# Patient Record
Sex: Female | Born: 1980 | Race: Black or African American | Hispanic: No | Marital: Married | Smoking: Never smoker
Health system: Southern US, Community
[De-identification: ages and names within clinical notes are randomized; demographics above are authoritative.]

## PROBLEM LIST (undated history)

## (undated) DIAGNOSIS — J309 Allergic rhinitis, unspecified: Secondary | ICD-10-CM

## (undated) DIAGNOSIS — IMO0002 Reserved for concepts with insufficient information to code with codable children: Secondary | ICD-10-CM

## (undated) DIAGNOSIS — Z789 Other specified health status: Secondary | ICD-10-CM

## (undated) DIAGNOSIS — D649 Anemia, unspecified: Secondary | ICD-10-CM

## (undated) HISTORY — DX: Reserved for concepts with insufficient information to code with codable children: IMO0002

## (undated) HISTORY — DX: Anemia, unspecified: D64.9

## (undated) HISTORY — PX: TUBAL LIGATION: SHX77

## (undated) HISTORY — DX: Allergic rhinitis, unspecified: J30.9

---
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
## 2005-10-28 ENCOUNTER — Inpatient Hospital Stay (HOSPITAL_COMMUNITY): Admission: AD | Admit: 2005-10-28 | Discharge: 2005-10-28 | Payer: Self-pay | Admitting: Gynecology

## 2005-11-07 ENCOUNTER — Emergency Department (HOSPITAL_COMMUNITY): Admission: EM | Admit: 2005-11-07 | Discharge: 2005-11-08 | Payer: Self-pay | Admitting: Emergency Medicine

## 2005-11-19 ENCOUNTER — Emergency Department (HOSPITAL_COMMUNITY): Admission: EM | Admit: 2005-11-19 | Discharge: 2005-11-19 | Payer: Self-pay | Admitting: Emergency Medicine

## 2006-01-13 ENCOUNTER — Inpatient Hospital Stay (HOSPITAL_COMMUNITY): Admission: AD | Admit: 2006-01-13 | Discharge: 2006-01-13 | Payer: Self-pay | Admitting: Gynecology

## 2007-01-24 ENCOUNTER — Inpatient Hospital Stay (HOSPITAL_COMMUNITY): Admission: AD | Admit: 2007-01-24 | Discharge: 2007-01-24 | Payer: Self-pay | Admitting: Obstetrics & Gynecology

## 2007-02-13 ENCOUNTER — Inpatient Hospital Stay (HOSPITAL_COMMUNITY): Admission: AD | Admit: 2007-02-13 | Discharge: 2007-02-13 | Payer: Self-pay | Admitting: Obstetrics and Gynecology

## 2007-04-16 IMAGING — US US OB COMP LESS 14 WK
1 series · 14 of 28 positions shown · non-contrast
Comparison: none

CLINICAL DATA: Abdominal pain.  

 OBSTETRICAL ULTRASOUND AND TRANSVAGINAL OB US:
 Number of Fetuses: 1
 Heart Rate:  106
 CRL:  2.7 mm  5 w 6 d
 Ultrasound EDC:  09/09/06
 Fetal anatomy could not be evaluated due to the early gestational age.
 MATERNAL UTERINE AND ADNEXAL FINDINGS
 Cervix not evaluated < 16 weeks.

[Series 1: us ob comp less 14 wk · 0.31mm/px · 14 of 36 slices shown]
[im 2/36]
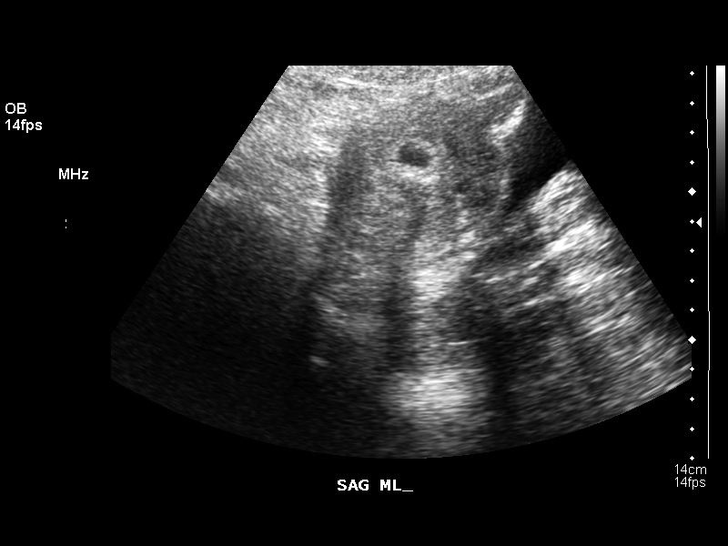
[im 4/36]
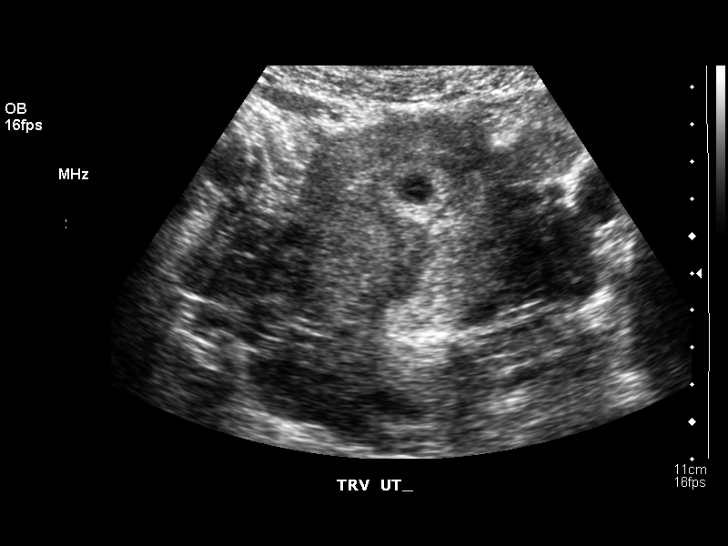
[im 7/36]
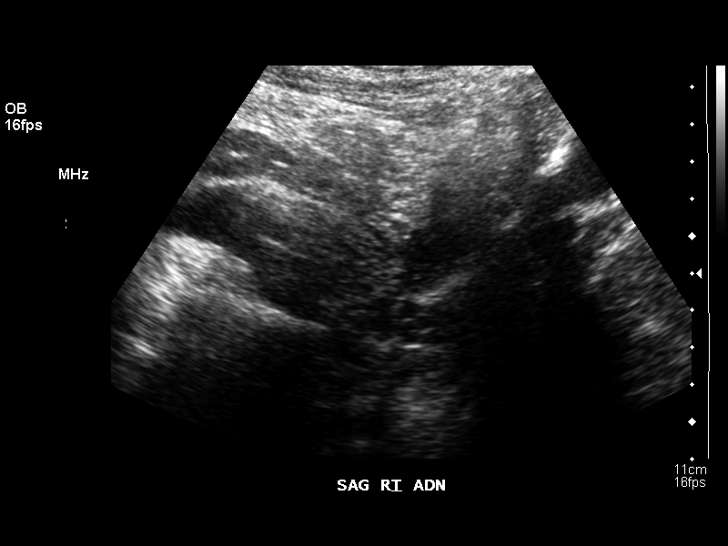
[im 10/36]
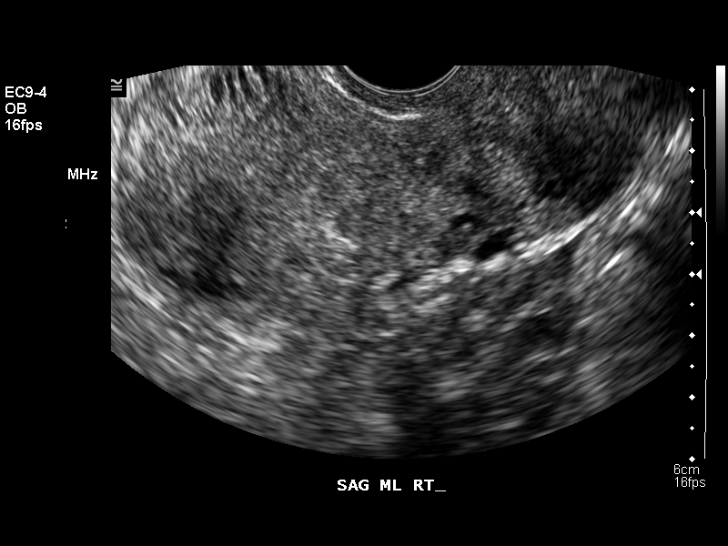
[im 12/36]
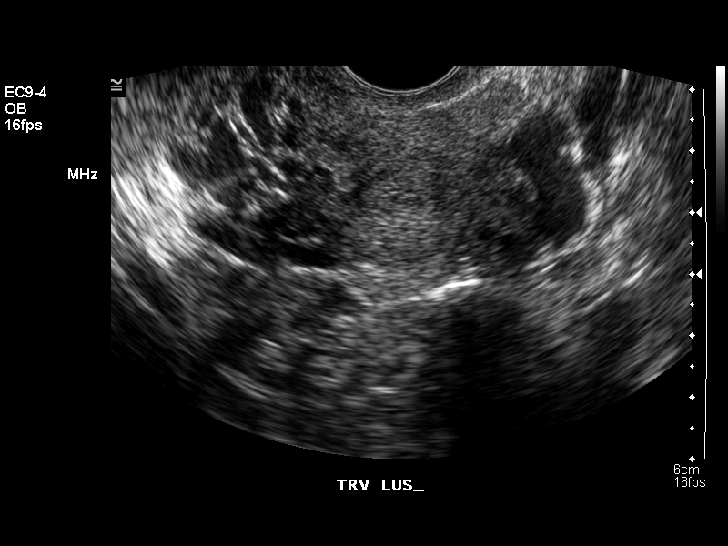
[im 15/36]
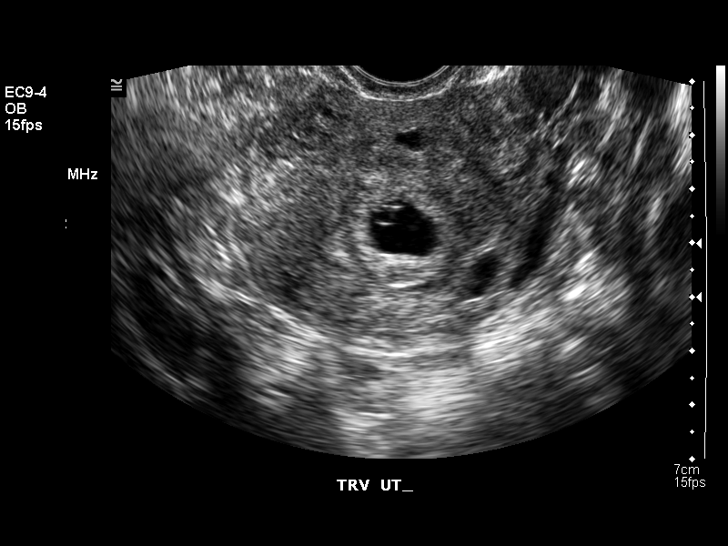
[im 17/36]
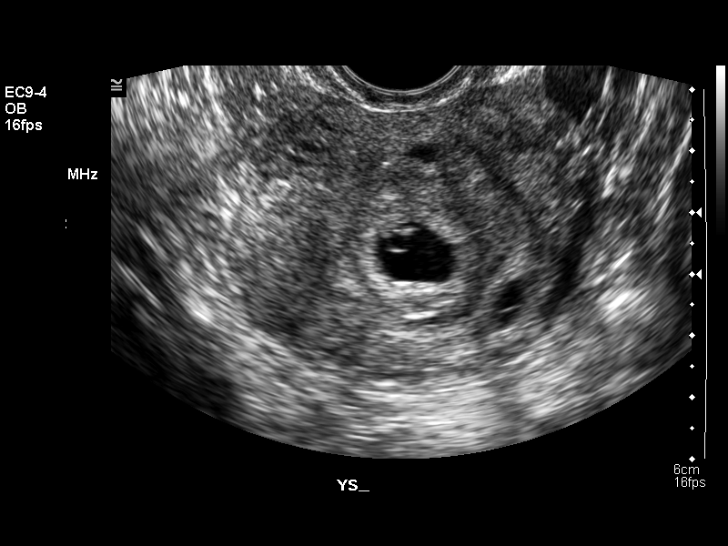
[im 20/36]
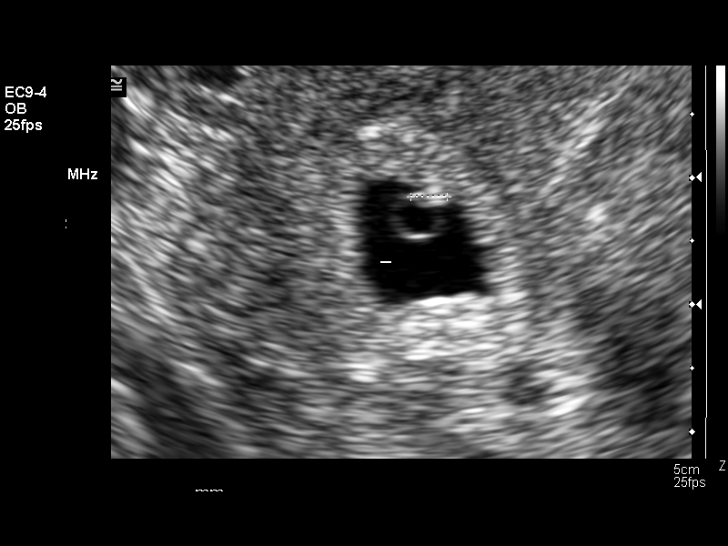
[im 23/36]
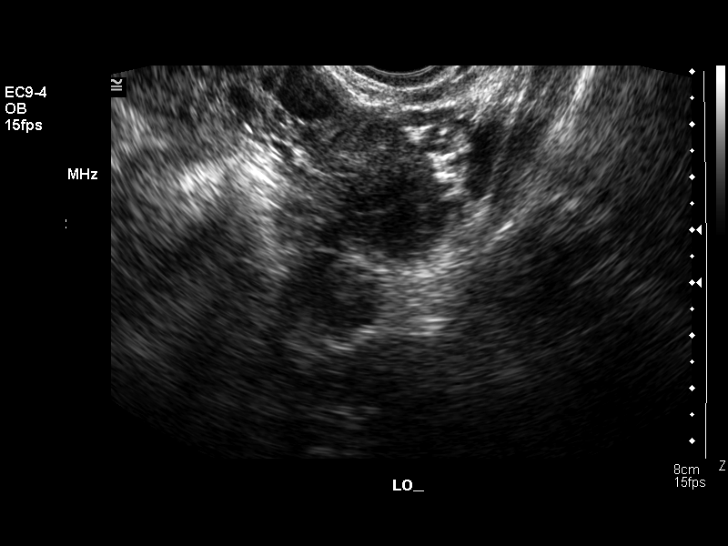
[im 25/36]
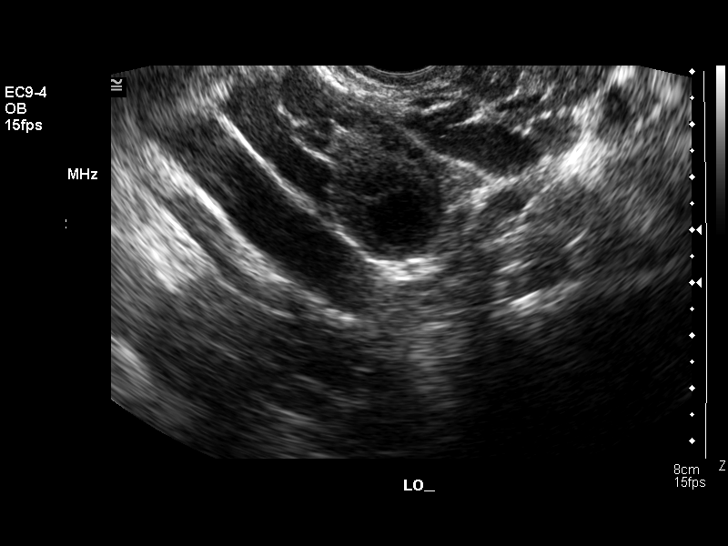
[im 28/36]
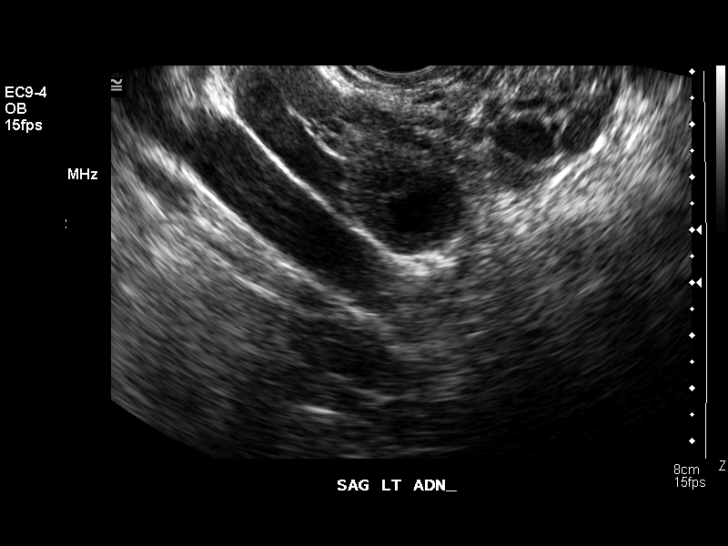
[im 30/36]
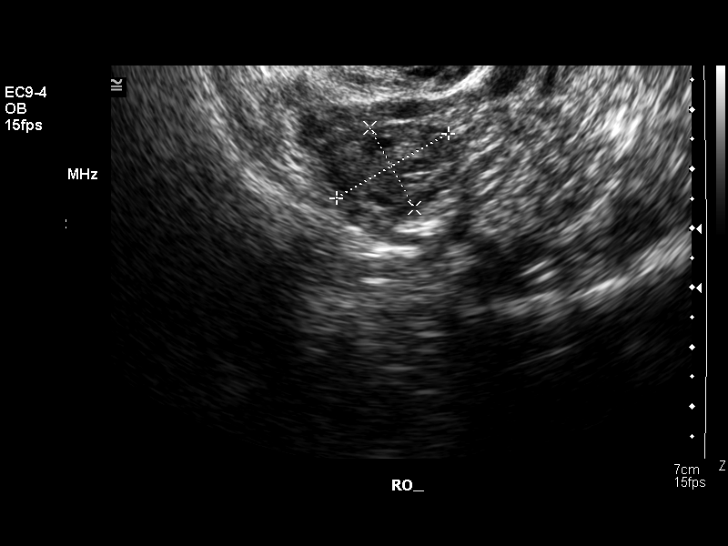
[im 33/36]
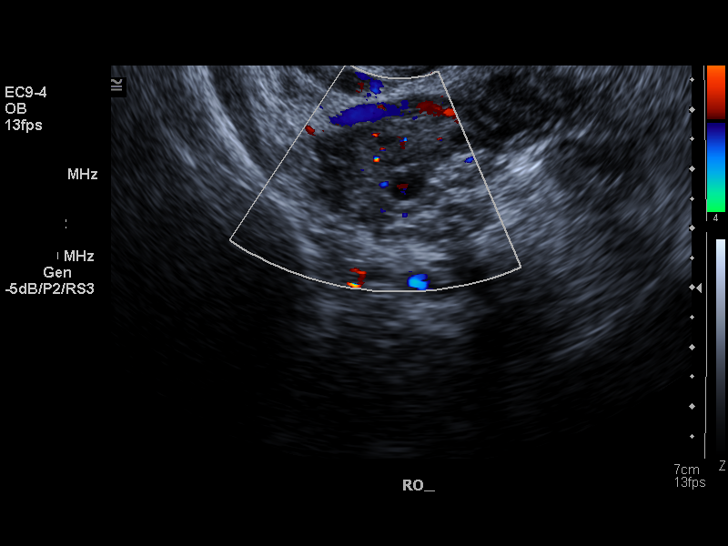
[im 36/36]
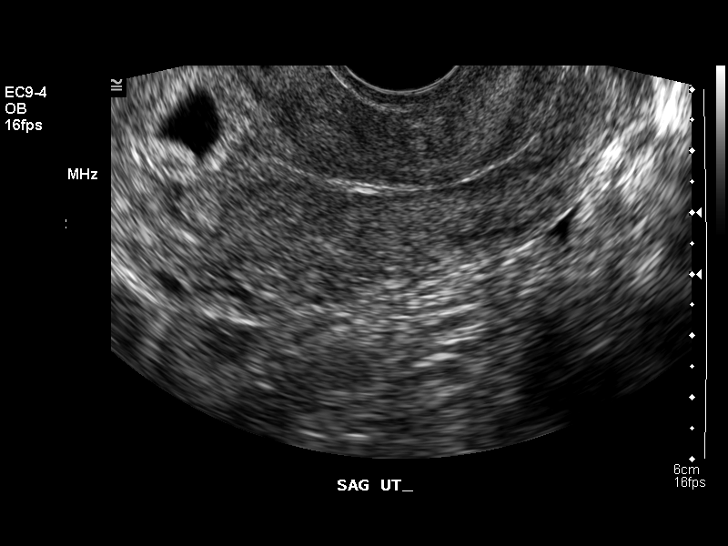

[14 of 28 positions shown; findings below may reference images not displayed]

IMPRESSION: 1.  Single living intrauterine gestation at estimated 5 weeks 6 day gestational age by crown rump length.  The yolk sac is visualized.  
 2.  A second smaller hypoechoic focus is identified discrete from the gestational sac.  This is not well-demonstrated on the provided images and could represent a small focus of subchorionic hemorrhage.  A second smaller associated gestational sac is considered much less likely.  Follow-up ultrasound in 7 to 10 days could be used to further assess.  
 3.  Left ovarian corpus luteum cyst with sonographically normal right ovary.  There is a trace amount of free fluid in the cul-de-sac.

## 2007-04-27 ENCOUNTER — Inpatient Hospital Stay (HOSPITAL_COMMUNITY): Admission: AD | Admit: 2007-04-27 | Discharge: 2007-04-27 | Payer: Self-pay | Admitting: Gynecology

## 2007-07-23 ENCOUNTER — Emergency Department (HOSPITAL_COMMUNITY): Admission: EM | Admit: 2007-07-23 | Discharge: 2007-07-23 | Payer: Self-pay | Admitting: Emergency Medicine

## 2008-02-28 ENCOUNTER — Inpatient Hospital Stay (HOSPITAL_COMMUNITY): Admission: AD | Admit: 2008-02-28 | Discharge: 2008-02-28 | Payer: Self-pay | Admitting: Obstetrics & Gynecology

## 2008-12-13 ENCOUNTER — Inpatient Hospital Stay (HOSPITAL_COMMUNITY): Admission: AD | Admit: 2008-12-13 | Discharge: 2008-12-13 | Payer: Self-pay | Admitting: Obstetrics & Gynecology

## 2009-01-03 ENCOUNTER — Emergency Department (HOSPITAL_COMMUNITY): Admission: EM | Admit: 2009-01-03 | Discharge: 2009-01-03 | Payer: Self-pay | Admitting: Emergency Medicine

## 2009-01-05 ENCOUNTER — Emergency Department (HOSPITAL_COMMUNITY): Admission: EM | Admit: 2009-01-05 | Discharge: 2009-01-05 | Payer: Self-pay | Admitting: Emergency Medicine

## 2010-11-18 LAB — GC/CHLAMYDIA PROBE AMP, GENITAL
Chlamydia, DNA Probe: NEGATIVE
GC Probe Amp, Genital: NEGATIVE

## 2010-11-18 LAB — WET PREP, GENITAL
Clue Cells Wet Prep HPF POC: NONE SEEN
Trich, Wet Prep: NONE SEEN
Yeast Wet Prep HPF POC: NONE SEEN

## 2010-11-18 LAB — URINALYSIS, ROUTINE W REFLEX MICROSCOPIC
Bilirubin Urine: NEGATIVE
Glucose, UA: NEGATIVE mg/dL
Hgb urine dipstick: NEGATIVE
Ketones, ur: NEGATIVE mg/dL
Nitrite: NEGATIVE
Protein, ur: NEGATIVE mg/dL
Specific Gravity, Urine: >1.03 — ABNORMAL HIGH (ref 1.005–1.030)
Urobilinogen, UA: 0.2 mg/dL (ref 0.0–1.0)
pH: 6 (ref 5.0–8.0)

## 2011-03-11 ENCOUNTER — Inpatient Hospital Stay (HOSPITAL_COMMUNITY): Payer: Self-pay

## 2011-03-11 ENCOUNTER — Encounter (HOSPITAL_COMMUNITY): Payer: Self-pay | Admitting: *Deleted

## 2011-03-11 ENCOUNTER — Inpatient Hospital Stay (HOSPITAL_COMMUNITY)
Admission: AD | Admit: 2011-03-11 | Discharge: 2011-03-11 | Disposition: A | Discharge status: Home / Self Care | Payer: Self-pay | Source: Ambulatory Visit | Attending: Obstetrics & Gynecology | Admitting: Obstetrics & Gynecology

## 2011-03-11 DIAGNOSIS — R109 Unspecified abdominal pain: Secondary | ICD-10-CM | POA: Insufficient documentation

## 2011-03-11 HISTORY — DX: Other specified health status: Z78.9

## 2011-03-11 LAB — URINALYSIS, ROUTINE W REFLEX MICROSCOPIC
Bilirubin Urine: NEGATIVE
Glucose, UA: NEGATIVE mg/dL
Hgb urine dipstick: NEGATIVE
Ketones, ur: NEGATIVE mg/dL
Leukocytes, UA: NEGATIVE
Nitrite: NEGATIVE
Protein, ur: NEGATIVE mg/dL
Specific Gravity, Urine: 1.015 (ref 1.005–1.030)
Urobilinogen, UA: 0.2 mg/dL (ref 0.0–1.0)
pH: 8 (ref 5.0–8.0)

## 2011-03-11 LAB — WET PREP, GENITAL
Trich, Wet Prep: NONE SEEN
Yeast Wet Prep HPF POC: NONE SEEN

## 2011-03-11 LAB — CBC
HCT: 35.1 % — ABNORMAL LOW (ref 36.0–46.0)
Hemoglobin: 11.5 g/dL — ABNORMAL LOW (ref 12.0–15.0)
MCH: 29.9 pg (ref 26.0–34.0)
MCHC: 32.8 g/dL (ref 30.0–36.0)
MCV: 91.4 fL (ref 78.0–100.0)
Platelets: 207 10*3/uL (ref 150–400)
RBC: 3.84 MIL/uL — ABNORMAL LOW (ref 3.87–5.11)
RDW: 13.6 % (ref 11.5–15.5)
WBC: 5.3 10*3/uL (ref 4.0–10.5)

## 2011-03-11 LAB — POCT PREGNANCY, URINE: Preg Test, Ur: NEGATIVE

## 2011-03-11 LAB — ABO/RH: ABO/RH(D): O POS

## 2011-03-11 LAB — HCG, QUANTITATIVE, PREGNANCY: hCG, Beta Chain, Quant, S: 1 m[IU]/mL (ref <5)

## 2011-03-11 MED ORDER — IBUPROFEN 600 MG PO TABS: 600.0000 mg | ORAL_TABLET | Freq: Four times a day (QID) | ORAL | Status: AC | PRN

## 2011-03-11 NOTE — ED Provider Notes (Signed)
Agree with above note.  Andrea Morris A 03/11/2011 2:33 PM

## 2011-03-11 NOTE — Progress Notes (Signed)
Pt states that 2 weeks ago she had nausea and did a HPT that was POS. Pt has had a BTL and was sure she was not pregnant.  Had heavy bleeding after the pregnancy test. Continues to have nausea, RLQ pain and low back pain that started around the same time. Only low back pain today.

## 2011-03-11 NOTE — ED Provider Notes (Signed)
History     Chief Complaint  Patient presents with  . Nausea   HPI History of positive pregnancy test at home.  Had heavy bleeding soon after that with clotting and cramping.  Yesterday began feeling bad with decreased appetite, low back pain, abdominal pain, and diarrhea.  Today has had vomiting, continued pain and diarrhea.  Still wonders if she might be pregnant.  Does not have a GYN doctor.  [facility].  OB History    Grav Para Term Preterm Abortions TAB SAB Ect Mult Living   5 5 5       5       Past Medical History  Diagnosis Date  . No pertinent past medical history     Past Surgical History  Procedure Date  . Tubal ligation     No family history on file.  History  Substance Use Topics  . Smoking status: Never Smoker   . Smokeless tobacco: Not on file  . Alcohol Use: Yes     occasional, social    Allergies: No Known Allergies  Prescriptions prior to admission  Medication Sig Dispense Refill  . Multiple Vitamins-Calcium (ONE-A-DAY WOMENS FORMULA) TABS Take 1 tablet by mouth daily.          Review of Systems  Gastrointestinal: Positive for abdominal pain.  Genitourinary:       No vaginal bleeding   Physical Exam   Blood pressure 108/86, pulse 75, temperature 97.8 F (36.6 C), resp. rate 16, height 5\' 5"  (1.651 m), weight 151 lb 3.2 oz (68.584 kg), last menstrual period 02/22/2011, SpO2 99.00%.  Physical Exam  Nursing note and vitals reviewed. Constitutional: She is oriented to person, place, and time. She appears well-developed and well-nourished.  HENT:  Head: Normocephalic.  Eyes: EOM are normal.  Neck: Neck supple.  GI: Soft. There is tenderness. There is no rebound and no guarding.  Genitourinary:       Speculum exam:  Small amount of creamy discharge Bimanual:  Cervix closed.  Uterus tender with exam.  Adnexa - no masses, nontender  Musculoskeletal: Normal range of motion.  Neurological: She is alert and oriented to  person, place, and time.  Skin: Skin is warm and dry.  Psychiatric: She has a normal mood and affect.    MAU Course  Procedures Results for orders placed during the hospital encounter of 03/11/11 (from the past 24 hour(s))  URINALYSIS, ROUTINE W REFLEX MICROSCOPIC     Status: Abnormal   Collection Time   03/11/11  9:49 AM      Component Value Range   Color, Urine YELLOW  YELLOW    Appearance HAZY (*) CLEAR    Specific Gravity, Urine 1.015  1.005 - 1.030    pH 8.0  5.0 - 8.0    Glucose, UA NEGATIVE  NEGATIVE (mg/dL)   Hgb urine dipstick NEGATIVE  NEGATIVE    Bilirubin Urine NEGATIVE  NEGATIVE    Ketones, ur NEGATIVE  NEGATIVE (mg/dL)   Protein, ur NEGATIVE  NEGATIVE (mg/dL)   Urobilinogen, UA 0.2  0.0 - 1.0 (mg/dL)   Nitrite NEGATIVE  NEGATIVE    Leukocytes, UA NEGATIVE  NEGATIVE   POCT PREGNANCY, URINE     Status: Normal   Collection Time   03/11/11  9:54 AM      Component Value Range   Preg Test, Ur NEGATIVE    CBC     Status: Abnormal   Collection Time   03/11/11 10:50 AM  Component Value Range   WBC 5.3  4.0 - 10.5 (K/uL)   RBC 3.84 (*) 3.87 - 5.11 (MIL/uL)   Hemoglobin 11.5 (*) 12.0 - 15.0 (g/dL)   HCT 14.7 (*) 82.9 - 46.0 (%)   MCV 91.4  78.0 - 100.0 (fL)   MCH 29.9  26.0 - 34.0 (pg)   MCHC 32.8  30.0 - 36.0 (g/dL)   RDW 56.2  13.0 - 86.5 (%)   Platelets 207  150 - 400 (K/uL)  HCG, QUANTITATIVE, PREGNANCY     Status: Normal   Collection Time   03/11/11 10:50 AM      Component Value Range   hCG, Beta Chain, Quant, S 1  <5 (mIU/mL)  ABO/RH     Status: Normal   Collection Time   03/11/11 10:50 AM      Component Value Range   ABO/RH(D) O POS    WET PREP, GENITAL     Status: Abnormal   Collection Time   03/11/11 11:30 AM      Component Value Range   Yeast, Wet Prep NONE SEEN  NONE SEEN    Trich, Wet Prep NONE SEEN  NONE SEEN    Clue Cells, Wet Prep FEW (*) NONE SEEN    WBC, Wet Prep HPF POC FEW (*) NONE SEEN     MDM Clinical Data: Pelvic pain. Abnormal bleeding.   TRANSABDOMINAL AND TRANSVAGINAL ULTRASOUND OF PELVIS  Technique: Both transabdominal and transvaginal ultrasound  examinations of the pelvis were performed. Transabdominal technique  was performed for global imaging of the pelvis including uterus,  ovaries, adnexal regions, and pelvic cul-de-sac.  Comparison: None.  It was necessary to proceed with endovaginal exam following the  transabdominal exam to visualize the ovaries.  Findings:  Uterus: Measures 9.6 by 4.8 by 6.3 cm. A subendometrial cyst in the  anterior uterus measures 5 mm. 3 mm subendometrial cyst is seen in  the posterior fundus. Uterine echotexture is slightly  heterogeneous with tiny echogenic focal areas with some linear  shadowing. No focal solid mass is identified within the uterus.  Endometrium: Normal in thickness and appearance. Is trilaminar and  measures 6 mm in maximum thickness.  Right ovary: The right ovary measures 2.8 x 1.6 x 2.2 cm and  contains a few small follicles. No ovarian or adnexal mass.  Left ovary: The left ovary measures 4.8 x 2.2 x 2.8 cm and contains  multiple follicles. It contains a dominant follicle that measures  1.7 cm. No ovarian or adnexal mass.  Other findings: No free fluid  IMPRESSION:  1. Question uterine adenomyosis.  2. The left ovary is mildly prominent in size and contains  multiple follicles, including a dominant follicle.  3. Normal right ovary.    Assessment and Plan  Not pregnant Abdominal pain  Plan: Ibuprofen for pain Call to make an appointment at GYN clinic if pain continues.   BURLESON,TERRI 03/11/2011, 10:39 AM   Nolene Bernheim, NP 03/11/11 1432

## 2011-03-12 LAB — GC/CHLAMYDIA PROBE AMP, GENITAL
Chlamydia, DNA Probe: NEGATIVE
GC Probe Amp, Genital: NEGATIVE

## 2011-05-08 LAB — WET PREP, GENITAL
Trich, Wet Prep: NONE SEEN
Yeast Wet Prep HPF POC: NONE SEEN

## 2011-05-08 LAB — POCT PREGNANCY, URINE
Operator id: 120561
Preg Test, Ur: NEGATIVE

## 2011-05-08 LAB — URINALYSIS, ROUTINE W REFLEX MICROSCOPIC
Bilirubin Urine: NEGATIVE
Glucose, UA: NEGATIVE
Hgb urine dipstick: NEGATIVE
Ketones, ur: NEGATIVE
Nitrite: NEGATIVE
Protein, ur: NEGATIVE
Specific Gravity, Urine: 1.02
Urobilinogen, UA: 0.2
pH: 7.5

## 2011-05-08 LAB — CBC
HCT: 34.5 — ABNORMAL LOW
Hemoglobin: 11.5 — ABNORMAL LOW
MCHC: 33.4
MCV: 86.5
Platelets: 234
RBC: 3.99
RDW: 14.8
WBC: 7.3

## 2011-05-08 LAB — GC/CHLAMYDIA PROBE AMP, GENITAL
Chlamydia, DNA Probe: NEGATIVE
GC Probe Amp, Genital: NEGATIVE

## 2011-05-21 LAB — GC/CHLAMYDIA PROBE AMP, GENITAL
Chlamydia, DNA Probe: NEGATIVE
GC Probe Amp, Genital: NEGATIVE

## 2011-05-21 LAB — WET PREP, GENITAL
Trich, Wet Prep: NONE SEEN
Yeast Wet Prep HPF POC: NONE SEEN

## 2011-05-21 LAB — URINALYSIS, ROUTINE W REFLEX MICROSCOPIC
Bilirubin Urine: NEGATIVE
Glucose, UA: NEGATIVE
Hgb urine dipstick: NEGATIVE
Ketones, ur: 15 — AB
Nitrite: NEGATIVE
Protein, ur: NEGATIVE
Specific Gravity, Urine: 1.015
Urobilinogen, UA: 1
pH: 7

## 2011-05-21 LAB — CBC
HCT: 35.9 — ABNORMAL LOW
Hemoglobin: 12.4
MCHC: 34.5
MCV: 89
Platelets: 199
RBC: 4.03
RDW: 13.1
WBC: 13.8 — ABNORMAL HIGH

## 2011-05-21 LAB — POCT PREGNANCY, URINE
Operator id: 181461
Preg Test, Ur: POSITIVE

## 2011-05-26 LAB — HCG, SERUM, QUALITATIVE: Preg, Serum: NEGATIVE

## 2011-05-26 LAB — GC/CHLAMYDIA PROBE AMP, GENITAL
Chlamydia, DNA Probe: NEGATIVE
GC Probe Amp, Genital: NEGATIVE

## 2011-05-26 LAB — WET PREP, GENITAL: Yeast Wet Prep HPF POC: NONE SEEN

## 2011-05-26 LAB — URINALYSIS, ROUTINE W REFLEX MICROSCOPIC
Glucose, UA: NEGATIVE
Hgb urine dipstick: NEGATIVE
Ketones, ur: NEGATIVE
Nitrite: NEGATIVE
Protein, ur: NEGATIVE
Specific Gravity, Urine: 1.025
Urobilinogen, UA: 1
pH: 6

## 2011-05-26 LAB — CBC
HCT: 36.9
Hemoglobin: 12.2
MCHC: 33.1
MCV: 89.4
Platelets: 248
RBC: 4.13
RDW: 13.9
WBC: 8.7

## 2011-05-26 LAB — URINE MICROSCOPIC-ADD ON

## 2011-05-26 LAB — POCT PREGNANCY, URINE
Operator id: 22333
Preg Test, Ur: NEGATIVE

## 2011-05-27 LAB — URINALYSIS, ROUTINE W REFLEX MICROSCOPIC
Bilirubin Urine: NEGATIVE
Glucose, UA: NEGATIVE
Hgb urine dipstick: NEGATIVE
Ketones, ur: NEGATIVE
Nitrite: NEGATIVE
Protein, ur: NEGATIVE
Specific Gravity, Urine: 1.025
Urobilinogen, UA: 0.2
pH: 6

## 2011-05-27 LAB — GC/CHLAMYDIA PROBE AMP, GENITAL
Chlamydia, DNA Probe: NEGATIVE
GC Probe Amp, Genital: NEGATIVE

## 2011-05-27 LAB — WET PREP, GENITAL
Clue Cells Wet Prep HPF POC: NONE SEEN
Trich, Wet Prep: NONE SEEN
Yeast Wet Prep HPF POC: NONE SEEN

## 2011-05-27 LAB — URINE MICROSCOPIC-ADD ON: RBC / HPF: NONE SEEN

## 2011-05-27 LAB — POCT PREGNANCY, URINE
Operator id: 22333
Preg Test, Ur: NEGATIVE

## 2011-05-27 LAB — HCG, SERUM, QUALITATIVE: Preg, Serum: NEGATIVE

## 2011-11-23 ENCOUNTER — Emergency Department (HOSPITAL_COMMUNITY)
Admission: EM | Admit: 2011-11-23 | Discharge: 2011-11-24 | Disposition: A | Discharge status: Home / Self Care | Payer: Managed Care, Other (non HMO) | Attending: Emergency Medicine | Admitting: Emergency Medicine

## 2011-11-23 ENCOUNTER — Encounter (HOSPITAL_COMMUNITY): Payer: Self-pay | Admitting: Emergency Medicine

## 2011-11-23 DIAGNOSIS — R42 Dizziness and giddiness: Secondary | ICD-10-CM | POA: Insufficient documentation

## 2011-11-23 DIAGNOSIS — F0781 Postconcussional syndrome: Secondary | ICD-10-CM | POA: Insufficient documentation

## 2011-11-23 LAB — POCT PREGNANCY, URINE: Preg Test, Ur: NEGATIVE
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
## 2011-11-24 ENCOUNTER — Emergency Department (HOSPITAL_COMMUNITY): Payer: Managed Care, Other (non HMO)

## 2011-11-24 LAB — POCT I-STAT, CHEM 8
BUN: 14 mg/dL (ref 6–23)
Calcium, Ion: 1.19 mmol/L (ref 1.12–1.32)
Chloride: 106 mEq/L (ref 96–112)
Creatinine, Ser: 0.7 mg/dL (ref 0.50–1.10)
Glucose, Bld: 93 mg/dL (ref 70–99)
HCT: 39 % (ref 36.0–46.0)
Hemoglobin: 13.3 g/dL (ref 12.0–15.0)
Potassium: 3.5 mEq/L (ref 3.5–5.1)
Sodium: 141 mEq/L (ref 135–145)
TCO2: 24 mmol/L (ref 0–100)

## 2011-11-24 LAB — CBC
HCT: 36.8 % (ref 36.0–46.0)
Hemoglobin: 12.2 g/dL (ref 12.0–15.0)
MCH: 29.6 pg (ref 26.0–34.0)
MCHC: 33.2 g/dL (ref 30.0–36.0)
MCV: 89.3 fL (ref 78.0–100.0)
Platelets: 229 10*3/uL (ref 150–400)
RBC: 4.12 MIL/uL (ref 3.87–5.11)
RDW: 12.8 % (ref 11.5–15.5)
WBC: 11.2 10*3/uL — ABNORMAL HIGH (ref 4.0–10.5)

## 2011-11-24 MED ORDER — MECLIZINE HCL 50 MG PO TABS: 50.0000 mg | ORAL_TABLET | Freq: Three times a day (TID) | ORAL | Status: AC | PRN

## 2011-11-24 MED ORDER — MECLIZINE HCL 25 MG PO TABS
25.0000 mg | ORAL_TABLET | Freq: Once | ORAL | Status: AC
  Administered 2011-11-24: 25 mg via ORAL
  Filled 2011-11-24: qty 1
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
## 2011-11-24 NOTE — Discharge Instructions (Signed)
Please followup with your primary care Dr. for recheck in 3-5 days. Also call the neurology clinic for followup. Return to the emergency department for worsening condition. Make sure that you're eating well and drinking enough fluids. Rest when you can. Take medications as prescribed. Return to the emergency room for worsening condition or new concerning symptoms.  Post-Concussion Syndrome Post-concussion syndrome describes the symptoms that can occur after a head injury. These symptoms can last from weeks to months. CAUSES  It is not clear why some head injuries cause post-concussion syndrome. It can occur whether your head injury was mild or severe and whether you were wearing head protection or not.  SYMPTOMS  Memory difficulties.   Dizziness.   Headaches.   Double vision or blurry vision.   Sensitivity to light.   Hearing difficulties.   Depression.   Tiredness.   Weakness.   Difficulty with concentration.   Difficulty sleeping or staying asleep.   Vomiting.  DIAGNOSIS  There is no test to determine whether you have post-concussion syndrome. Your caregiver may order an imaging scan of your brain, such as a CT scan, to check for other problems that may be causing your symptoms (such as severe injury inside your skull). TREATMENT  Usually, these problems disappear over time without medical care. Your caregiver may prescribe medicine to help ease your symptoms. It is important to follow up with a neurologist to evaluate your recovery and address any lingering symptoms or issues. HOME CARE INSTRUCTIONS   Only take over-the-counter or prescription medicines for pain, discomfort, or fever as directed by your caregiver. Do not take aspirin. Aspirin can slow blood clotting.   Sleep with your head slightly elevated to help with headaches.   Avoid any situation where there is potential for another head injury (football, hockey, martial arts, horseback riding). Your condition will  get worse every time you experience a concussion. You should avoid these activities until you are evaluated by the appropriate follow-up caregivers.   Keep all follow-up appointments as directed by your caregiver.  SEEK IMMEDIATE MEDICAL CARE IF:  You develop confusion or unusual drowsiness.   You cannot wake the injured person.   You develop nausea or persistent, forceful vomiting.   You feel like you are moving when you are not (vertigo).   You notice the injured person's eyes moving rapidly back and forth. This may be a sign of vertigo.   You have convulsions or faint.   You have severe, persistent headaches that are not relieved by medicine.   You cannot use your arms or legs normally.   Your pupils change size.   You have clear or bloody discharge from the nose or ears.   Your problems are getting worse, not better.  MAKE SURE YOU:  Understand these instructions.   Will watch your condition.   Will get help right away if you are not doing well or get worse.  Document Released: 01/16/2002 Document Revised: 07/16/2011 Document Reviewed: 02/12/2011 [facility] Patient Information 2012 [place], [place].

## 2014-03-21 ENCOUNTER — Encounter: Payer: Self-pay | Admitting: Gynecology

## 2014-03-21 ENCOUNTER — Ambulatory Visit (INDEPENDENT_AMBULATORY_CARE_PROVIDER_SITE_OTHER): Payer: BC Managed Care – PPO | Admitting: Gynecology

## 2014-03-21 VITALS — BP 102/70 | HR 74 | Resp 16 | Ht 66.0 in | Wt 156.0 lb

## 2014-03-21 DIAGNOSIS — D649 Anemia, unspecified: Secondary | ICD-10-CM

## 2014-03-21 DIAGNOSIS — N920 Excessive and frequent menstruation with regular cycle: Secondary | ICD-10-CM

## 2014-03-21 DIAGNOSIS — N898 Other specified noninflammatory disorders of vagina: Secondary | ICD-10-CM

## 2014-03-21 DIAGNOSIS — Z124 Encounter for screening for malignant neoplasm of cervix: Secondary | ICD-10-CM

## 2014-03-21 DIAGNOSIS — Z0000 Encounter for general adult medical examination without abnormal findings: Secondary | ICD-10-CM

## 2014-03-21 DIAGNOSIS — N9489 Other specified conditions associated with female genital organs and menstrual cycle: Secondary | ICD-10-CM

## 2014-03-21 DIAGNOSIS — Z01419 Encounter for gynecological examination (general) (routine) without abnormal findings: Secondary | ICD-10-CM

## 2014-03-21 LAB — IBC PANEL
%SAT: 13 % — ABNORMAL LOW (ref 20–55)
TIBC: 351 ug/dL (ref 250–470)
UIBC: 307 ug/dL (ref 125–400)

## 2014-03-21 LAB — HEMOGLOBIN, FINGERSTICK: Hemoglobin, fingerstick: 11.7 g/dL — ABNORMAL LOW (ref 12.0–16.0)

## 2014-03-21 LAB — IRON: Iron: 44 ug/dL (ref 42–145)

## 2014-03-21 MED ORDER — TINIDAZOLE 500 MG PO TABS: 2.0000 g | ORAL_TABLET | Freq: Once | ORAL | Status: DC

## 2014-03-21 NOTE — Progress Notes (Signed)
33 y.o. Engaged African American female   [phone] here for annual exam. Pt is currently sexually active.  Pt reports dyspareunia associated with vaginal discharge.  Pt reports musty discharge, pt was treated with antibiotic? And otc rx but did not work.  Pt does not douche.  Odor is worse after sex, menses.  No tub bathes. Cycles are regular, flow 5-7d, no clots, changes pad once an hour for 2d.  Patient's last menstrual period was 03/15/2014.          Sexually active: Yes.    The current method of family planning is tubal ligation.    Exercising: Yes.    walking at work Last pap: 1 year ago- normal Alcohol: occasionally  Tobacco: no BSE: no  Hgb: 11.7 ; Urine: Negative    There are no preventive care reminders to display for this patient.  Family History  Problem Relation Age of Onset  . Diabetes Mellitus I Daughter     There are no active problems to display for this patient.   Past Medical History  Diagnosis Date  . No pertinent past medical history   . Anemia   . Dyspareunia     Past Surgical History  Procedure Laterality Date  . Tubal ligation  6 years ago     Allergies: Review of patient's allergies indicates no known allergies.  No current outpatient prescriptions on file.   No current facility-administered medications for this visit.    ROS: Pertinent items are noted in HPI.  Exam:    BP 102/70  Pulse 74  Resp 16  Ht 5\' 6"  (1.676 m)  Wt 156 lb (70.761 kg)  BMI 25.19 kg/m2  LMP 03/15/2014 Weight change: @WEIGHTCHANGE @ Last 3 height recordings:  Ht Readings from Last 3 Encounters:  03/21/14 5\' 6"  (1.676 m)  03/11/11 5\' 5"  (1.651 m)   General appearance: alert, cooperative and appears stated age Head: Normocephalic, without obvious abnormality, atraumatic Neck: no adenopathy, no carotid bruit, no JVD, supple, symmetrical, trachea midline and thyroid not enlarged, symmetric, no tenderness/mass/nodules Lungs: clear to auscultation  bilaterally Breasts: normal appearance, no masses or tenderness Heart: regular rate and rhythm, S1, S2 normal, no murmur, click, rub or gallop Abdomen: soft, non-tender; bowel sounds normal; no masses,  no organomegaly Extremities: extremities normal, atraumatic, no cyanosis or edema Skin: Skin color, texture, turgor normal. No rashes or lesions, shallow lesion on left upper inner thigh-ingrown hair Lymph nodes: Cervical, supraclavicular, and axillary nodes normal. no inguinal nodes palpated Neurologic: Grossly normal   Pelvic: External genitalia:  normal escutcheon              Urethra: normal appearing urethra with no masses, tenderness or lesions              Bartholins and Skenes: Bartholin's, Urethra, Skene's normal                 Vagina: normal appearing vagina with normal color and discharge, no lesions, vaginal discharge - white, scant and thin, pH 5.5, +whiff, insufficient discharge for slide              Cervix: normal appearance and patulous              Pap taken: Yes.          Bimanual Exam:  Uterus:  uterus is normal size, shape, consistency and nontender, examined upright mild descent and rectocele shortening vagina  Adnexa:    no masses                                      Rectovaginal: normal                                      Anus:  normal sphincter tone, no lesions    1. 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 Laboratory examination ordered as part of a routine general medical examination  - POCT Urinalysis Dipstick - Hemoglobin, fingerstick  3. Menorrhagia with regular cycle Pt mildly anemic, pt reports normal hemoglobin,  recommend PUS, iron studies  4. Screening for cervical cancer  - Pap Test with HP (IPS)  5. Vaginal  odor Insufficient material for slide, suspect BV based on pH and whiff,  Genital hygiene reviewed Recurrent problem, recommend test of cure and discussed suppression options - tinidazole (TINDAMAX) 500 MG tablet; Take 4 tablets (2,000 mg total) by mouth once. For 2d  Dispense: 8 tablet; Refill: 0  An After Visit Summary was printed and given to the patient.

## 2014-03-22 ENCOUNTER — Telehealth: Payer: Self-pay

## 2014-03-22 LAB — FERRITIN: Ferritin: 7 ng/mL — ABNORMAL LOW (ref 10–291)

## 2014-03-22 NOTE — Telephone Encounter (Signed)
Spoke with patient. Advised of message from Dr.Lathrop. Patient is agreeable. As we were about to scheduled phone disconnected. Tried to reach patient back x2 but call went straight to voicemail. Left message to call [facility] at [phone].

## 2014-03-22 NOTE — Telephone Encounter (Signed)
Left message to call [place] at [phone].  Per Dr.Lathrop advised PUS recommended for heavy bleeding with cycle. Dr.Lathrop forgo to mention this at appointment yesterday.

## 2014-03-22 NOTE — Telephone Encounter (Signed)
Pt is returning a call to [facility]

## 2014-03-22 NOTE — Telephone Encounter (Signed)
Left message to call Kaitlyn at [phone]. 

## 2014-03-23 ENCOUNTER — Telehealth: Payer: Self-pay | Admitting: *Deleted

## 2014-03-23 LAB — IPS PAP TEST WITH HPV

## 2014-03-23 NOTE — Telephone Encounter (Signed)
Spoke with patient at time of incoming call. I could not hear anyone on the line and phone call ended. Returned call to patient with no answer. Left message to call [place] at [phone].

## 2014-03-23 NOTE — Telephone Encounter (Signed)
Patient notified of results by Rolla Etienne ,RN  Routed to provider for review, encounter closed.

## 2014-03-23 NOTE — Telephone Encounter (Signed)
Spoke with patient. Appointment scheduled for ultrasound on 8/25 at 2:30pm with 3pm consult with Dr.Lathrop. Patient agreeable to date and time. Pelvic U/S scheduled and patient aware/agreeable to time.  Patient verbalized understanding of the U/S appointment cancellation policy. Advised will need to cancel within 72 business hours (3 business days) or will have $100.00 no show fee placed to account. Patient would like to know about cost. Advised patient precert has not yet been done but once it is someone in the billing department will be in contact with her regarding OOP cost. Patient is agreeable. Results given as seen below from Dr.Lathrop patient is agreeable and verbalizes understanding.   Notes Recorded by Azalia Bilis, MD on 03/23/2014 at 7:38 AM Inform pt she is iron deficient and shoud take otc iron supplements twice a day, might want to take a stool softener as well as can be constipating-colace should be fine  CC: Felipa Emory for [employer] to provider for final review. Patient agreeable to disposition. Will close encounter

## 2014-03-23 NOTE — Telephone Encounter (Signed)
Message copied by Alfonzo Feller on Fri Mar 23, 2014  9:18 AM ------      Message from: Elveria Rising      Created: Fri Mar 23, 2014  7:38 AM       Inform pt she is iron deficient and shoud take otc iron supplements twice a day, might want to take a stool softener as well as can be constipating-colace should be fine ------

## 2014-03-23 NOTE — Telephone Encounter (Signed)
Patient returning Jasmine's call.  

## 2014-03-23 NOTE — Telephone Encounter (Signed)
Left Message To Call Back  

## 2014-03-26 NOTE — Telephone Encounter (Signed)
Left message for patient to call back. Need to go over benefits for and schedule PUS.

## 2014-03-26 NOTE — Telephone Encounter (Signed)
Spoke with patient. Advised that copay for PUS is $25. Confirmed for patient that appt is 08.25.2015 @ 1430.

## 2014-04-03 ENCOUNTER — Ambulatory Visit (INDEPENDENT_AMBULATORY_CARE_PROVIDER_SITE_OTHER): Payer: BC Managed Care – PPO | Admitting: Gynecology

## 2014-04-03 ENCOUNTER — Ambulatory Visit (INDEPENDENT_AMBULATORY_CARE_PROVIDER_SITE_OTHER): Payer: BC Managed Care – PPO

## 2014-04-03 VITALS — BP 112/70 | Resp 16 | Ht 66.0 in | Wt 159.0 lb

## 2014-04-03 DIAGNOSIS — N898 Other specified noninflammatory disorders of vagina: Secondary | ICD-10-CM

## 2014-04-03 DIAGNOSIS — N920 Excessive and frequent menstruation with regular cycle: Secondary | ICD-10-CM

## 2014-04-03 DIAGNOSIS — N9489 Other specified conditions associated with female genital organs and menstrual cycle: Secondary | ICD-10-CM

## 2014-04-03 DIAGNOSIS — D649 Anemia, unspecified: Secondary | ICD-10-CM

## 2014-04-03 MED ORDER — NONFORMULARY OR COMPOUNDED ITEM: Status: DC
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
## 2014-04-03 NOTE — Patient Instructions (Signed)
PLACE 1 CAPSULE IN VAGINA FOR 3 NIGHTS IN A ROW, NO SEX, THEN ROUTINELY AFTER SEX AND MENSES

## 2014-04-06 ENCOUNTER — Ambulatory Visit: Payer: BC Managed Care – PPO | Admitting: Gynecology

## 2014-04-06 NOTE — Telephone Encounter (Signed)
Patient dnka two week recheck appointment today @12 :00. I left patient a message to call and reschedule.

## 2014-05-07 ENCOUNTER — Ambulatory Visit: Payer: BC Managed Care – PPO | Admitting: Gynecology

## 2014-05-09 ENCOUNTER — Ambulatory Visit: Payer: BC Managed Care – PPO | Admitting: Gynecology

## 2014-05-09 ENCOUNTER — Telehealth: Payer: Self-pay | Admitting: Gynecology

## 2014-05-09 NOTE — Telephone Encounter (Signed)
Spoke with patient. Appointment rescheduled for Friday at 2pm (time per provider). Patient agreeable to date and time.  Routing to provider for final review. Patient agreeable to disposition. Will close encounter

## 2014-05-09 NOTE — Telephone Encounter (Signed)
Patient dnka her 2 week recheck appointment with Dr.Lathrop. I called patient and left a message for her to call and reschedule.

## 2014-05-09 NOTE — Telephone Encounter (Signed)
Patient's original appointment 05/07/14 was cx (provider was delayed in surgery) and rescheduled to today. Patient says "she forgot the appointment today". Patient need to reschedule 2 week reck. No appointment available with Dr.Lathrop.Marland Kitchen

## 2014-05-11 ENCOUNTER — Ambulatory Visit (INDEPENDENT_AMBULATORY_CARE_PROVIDER_SITE_OTHER): Payer: BC Managed Care – PPO | Admitting: Gynecology

## 2014-05-11 VITALS — BP 98/78 | Temp 97.7°F | Resp 12 | Ht 66.0 in | Wt 168.0 lb

## 2014-05-11 DIAGNOSIS — D500 Iron deficiency anemia secondary to blood loss (chronic): Secondary | ICD-10-CM

## 2014-05-11 DIAGNOSIS — N920 Excessive and frequent menstruation with regular cycle: Secondary | ICD-10-CM

## 2014-05-11 DIAGNOSIS — N898 Other specified noninflammatory disorders of vagina: Secondary | ICD-10-CM

## 2014-05-11 DIAGNOSIS — N9489 Other specified conditions associated with female genital organs and menstrual cycle: Secondary | ICD-10-CM

## 2014-05-11 LAB — POCT WET PREP (WET MOUNT): Clue Cells Wet Prep Whiff POC: POSITIVE

## 2014-05-11 LAB — POCT URINALYSIS DIPSTICK
Urobilinogen, UA: NEGATIVE
pH, UA: 5

## 2014-05-11 LAB — CBC
HCT: 37.3 % (ref 36.0–46.0)
Hemoglobin: 12.5 g/dL (ref 12.0–15.0)
MCH: 29.8 pg (ref 26.0–34.0)
MCHC: 33.5 g/dL (ref 30.0–36.0)
MCV: 89 fL (ref 78.0–100.0)
Platelets: 268 10*3/uL (ref 150–400)
RBC: 4.19 MIL/uL (ref 3.87–5.11)
RDW: 13.9 % (ref 11.5–15.5)
WBC: 7.3 10*3/uL (ref 4.0–10.5)

## 2014-05-11 MED ORDER — METRONIDAZOLE 0.75 % VA GEL: VAGINAL | Status: DC

## 2014-05-11 NOTE — Progress Notes (Signed)
Subjective:     Patient ID: Andrea Morris, female   DOB: February 05, 1981, 33 y.o.   MRN: [id number]  HPI Comments: Pt is here for test of cure after recurrent BV.  Pt is using boric acid every 3rd night and after sex.  Pt  Denies any issue with vaginal discharge or odor.  Partner without complaints. Pt reports change in urination, more frequent but no change in stream, dysuria or hematuria.      Review of Systems Per HPI    Objective:   Physical Exam  Nursing note and vitals reviewed. Constitutional: She is oriented to person, place, and time. She appears well-developed and well-nourished.  Genitourinary:   Pelvic: External genitalia:  no lesions              Urethra:  normal appearing urethra with no masses, tenderness or lesions              Bartholins and Skenes: normal                 Vagina: moderate thick white discharge              Cervix: normal appearance                     Bimanual Exam:  Uterus:  uterus is normal size, shape, consistency and tender anteriorly and under bladder                                       Adnexa: normal adnexa in size, nontender and no masses                                        Neurological: She is alert and oriented to person, place, and time.       Assessment:     Persistent BV Change in urination     Plan:     Will try metrogel for suppression, stop boric acid tindmax for treatment Urine culture  F/u 72m

## 2014-05-12 LAB — URINE CULTURE
Colony Count: NO GROWTH
Organism ID, Bacteria: NO GROWTH

## 2014-05-14 ENCOUNTER — Telehealth: Payer: Self-pay | Admitting: *Deleted

## 2014-05-14 NOTE — Telephone Encounter (Signed)
Pt informed of results.

## 2014-05-14 NOTE — Telephone Encounter (Signed)
Patient returning call.

## 2014-05-14 NOTE — Telephone Encounter (Signed)
Message copied by Alfonzo Feller on Mon May 14, 2014  9:04 AM ------      Message from: Elveria Rising      Created: Mon May 14, 2014  8:22 AM       Urine culture is negative, WBC are normal, cbc is good, hopefully feeling better after tindamax ------

## 2014-05-14 NOTE — Telephone Encounter (Signed)
Left Message To Call Back  

## 2014-05-17 ENCOUNTER — Telehealth: Payer: Self-pay | Admitting: Gynecology

## 2014-05-17 MED ORDER — TINIDAZOLE 500 MG PO TABS: ORAL_TABLET | ORAL | Status: DC

## 2014-05-17 NOTE — Telephone Encounter (Signed)
Patient calling re: "medication not called in."

## 2014-05-17 NOTE — Telephone Encounter (Signed)
Spoke with patient. She did not get rx Tindamax for treatment for BV at last visit with Dr. Charlies Constable.  Order placed and patient agreeable. Routing to provider for final review. Patient agreeable to disposition. Will close encounter

## 2014-06-11 ENCOUNTER — Encounter: Payer: Self-pay | Admitting: Gynecology

## 2014-06-15 ENCOUNTER — Telehealth: Payer: Self-pay | Admitting: Gynecology

## 2014-06-15 NOTE — Telephone Encounter (Signed)
Spoke with patient. She states she just doesn't know what to do anymore about persistent BV symptoms. Describes a musty smell that has been ongoing and this current episode is no different than usual. No vaginal discharge. Has used boric acid supositories (Dr. Charlies Constable advised her to dc these) Used Tindamax with some relief and is using Metrogel every Sunday night for maintenance.  Patient with hx of tubal ligation.  Advised patient that there are no surgical options related to BV, but that I could request that Dr. Sabra Heck review chart and provide any advice for care going forward. Patient declines to schedule office visit for evaluation, would like Dr. Sabra Heck to review first.  Advised would call back with instructions from Dr. Sabra Heck.

## 2014-06-15 NOTE — Telephone Encounter (Signed)
Pt returning call

## 2014-06-15 NOTE — Telephone Encounter (Signed)
Pt wants to talk with the nurse. She states the medication Dr. Charlies Constable has her taking is not working she wants to discuss surgery.

## 2014-06-15 NOTE — Telephone Encounter (Signed)
Message left to return call to Shariece Viveiros at [phone].    

## 2014-06-19 NOTE — Telephone Encounter (Signed)
I think pt needs affirm testing to be 100% sure she actually has BV.  Only see that wet smears have been done.  May need vaginal culture as well.  Usually boric acid and Metronidazole for suppression does work so maybe it is something else.  My recommendations would be to stop everything and make sure she is having symptoms the day of her visit.  Thanks.

## 2014-06-19 NOTE — Telephone Encounter (Signed)
Message left to return call to Chestina Komatsu at [phone].    

## 2014-06-26 NOTE — Telephone Encounter (Signed)
Patient returned call.  She continues with symptoms. Using only metrogel on Sunday nights. Has not used boric acid capsules since Dr. Charlies Constable advised to dc.  She is agreeable to office visit for additional evaluation. Advised of message from Dr. Sabra Heck, may need additional testing to ensure BV. Scheduled for office visit with Regina Eck CNM. Advised to dc metrogel on Sunday 07/01/14. Patient agreeable.   Routing note to Regina Eck CNM as patient is scheduled for 07/04/14 for appointment.

## 2014-07-04 ENCOUNTER — Ambulatory Visit: Payer: BC Managed Care – PPO | Admitting: Certified Nurse Midwife

## 2014-07-04 ENCOUNTER — Telehealth: Payer: Self-pay | Admitting: Certified Nurse Midwife

## 2014-07-04 NOTE — Telephone Encounter (Signed)
Patient called and cancelled her appointment for "BV" today due to "stuck at work and unable to leave due to staffing." I did not charge a missed appointment fee. FYI only. Patient rescheduled to 07/10/14

## 2014-07-10 ENCOUNTER — Ambulatory Visit: Payer: BC Managed Care – PPO | Admitting: Certified Nurse Midwife

## 2014-07-19 ENCOUNTER — Ambulatory Visit: Payer: BC Managed Care – PPO | Admitting: Certified Nurse Midwife

## 2014-07-19 ENCOUNTER — Encounter: Payer: Self-pay | Admitting: Certified Nurse Midwife

## 2014-09-17 ENCOUNTER — Emergency Department (INDEPENDENT_AMBULATORY_CARE_PROVIDER_SITE_OTHER)
Admission: EM | Admit: 2014-09-17 | Discharge: 2014-09-17 | Disposition: A | Discharge status: Home / Self Care | Payer: BLUE CROSS/BLUE SHIELD | Source: Home / Self Care | Attending: Family Medicine | Admitting: Family Medicine

## 2014-09-17 ENCOUNTER — Encounter (HOSPITAL_COMMUNITY): Payer: Self-pay | Admitting: Emergency Medicine

## 2014-09-17 DIAGNOSIS — K2970 Gastritis, unspecified, without bleeding: Secondary | ICD-10-CM

## 2014-09-17 DIAGNOSIS — N12 Tubulo-interstitial nephritis, not specified as acute or chronic: Secondary | ICD-10-CM

## 2014-09-17 DIAGNOSIS — R0789 Other chest pain: Secondary | ICD-10-CM

## 2014-09-17 LAB — POCT URINALYSIS DIP (DEVICE)
Bilirubin Urine: NEGATIVE
Glucose, UA: NEGATIVE mg/dL
Ketones, ur: NEGATIVE mg/dL
Nitrite: POSITIVE — AB
Protein, ur: 30 mg/dL — AB
Specific Gravity, Urine: 1.025 (ref 1.005–1.030)
Urobilinogen, UA: 0.2 mg/dL (ref 0.0–1.0)
pH: 7 (ref 5.0–8.0)

## 2014-09-17 MED ORDER — CEFTRIAXONE SODIUM 1 G IJ SOLR
1.0000 g | Freq: Once | INTRAMUSCULAR | Status: AC
  Administered 2014-09-17: 1 g via INTRAMUSCULAR

## 2014-09-17 MED ORDER — OMEPRAZOLE 40 MG PO CPDR: 40.0000 mg | DELAYED_RELEASE_CAPSULE | Freq: Every day | ORAL | Status: DC

## 2014-09-17 MED ORDER — CIPROFLOXACIN HCL 500 MG PO TABS: 500.0000 mg | ORAL_TABLET | Freq: Two times a day (BID) | ORAL | Status: DC

## 2014-09-17 MED ORDER — PHENAZOPYRIDINE HCL 200 MG PO TABS: 200.0000 mg | ORAL_TABLET | Freq: Three times a day (TID) | ORAL | Status: DC

## 2014-09-17 MED ORDER — CEFTRIAXONE SODIUM 1 G IJ SOLR
INTRAMUSCULAR | Status: AC
  Filled 2014-09-17: qty 10

## 2014-09-17 MED ORDER — LIDOCAINE HCL (PF) 1 % IJ SOLN
INTRAMUSCULAR | Status: AC
  Filled 2014-09-17: qty 5

## 2014-09-17 NOTE — ED Provider Notes (Signed)
CSN: [id number]     Arrival date & time 09/17/14  7342 History   First MD Initiated Contact with Patient 09/17/14 0935     Chief Complaint  Patient presents with  . Urinary Tract Infection   (Consider location/radiation/quality/duration/timing/severity/associated sxs/prior Treatment) HPI        34 year old female presents with multiple complaints. Mainly, she has urinary frequency, urgency, dysuria, gross hematuria, and right-sided flank pain. This started a few days ago and has gradually worsened. No fever, chills, NVD. She is concerned because she says she has exposure to toxins at work that is known to cause bladder cancer. They have the doctor coming to their work to evaluate her and her coworkers later this week.  Also she complains of chest pain. She has chest pain one week he has that was associated with left arm numbness. This resolved spontaneously within a few minutes. She also has upper abdominal pain. She thinks she is having indigestion. She is not having any chest pain now and has not had any chest pain in the past week. She also admits to some diarrhea over the past week, nonbloody. No history of chest pain or heart problems. No chest pain with exertion. No shortness of breath or easy fatigability.  Past Medical History  Diagnosis Date  . No pertinent past medical history   . Anemia   . Dyspareunia    Past Surgical History  Procedure Laterality Date  . Tubal ligation  6 years ago    Family History  Problem Relation Age of Onset  . Diabetes Mellitus I Daughter    History  Substance Use Topics  . Smoking status: Never Smoker   . Smokeless tobacco: Never Used  . Alcohol Use: Yes     Comment: occasional, social   OB History    Gravida Para Term Preterm AB TAB SAB Ectopic Multiple Living   5 5 5       5      Review of Systems  Constitutional: Negative for fever and chills.  Respiratory: Negative for cough and shortness of breath.   Cardiovascular: Positive for chest  pain.  Gastrointestinal: Positive for abdominal pain and diarrhea. Negative for nausea and vomiting.  Genitourinary: Positive for dysuria, urgency, frequency, hematuria and flank pain. Negative for vaginal discharge.  Neurological: Positive for numbness. Negative for weakness.  All other systems reviewed and are negative.   Allergies  Review of patient's allergies indicates no known allergies.  Home Medications   Prior to Admission medications   Medication Sig Start Date End Date Taking? Authorizing Provider  ciprofloxacin (CIPRO) 500 MG tablet Take 1 tablet (500 mg total) by mouth every 12 (twelve) hours. 09/17/14   Liam Graham, PA-C  metroNIDAZOLE (METROGEL) 0.75 % vaginal gel 1 applicator every sunday 05/11/14   Elveria Rising, MD  omeprazole (PRILOSEC) 40 MG capsule Take 1 capsule (40 mg total) by mouth daily. 09/17/14   Liam Graham, PA-C  phenazopyridine (PYRIDIUM) 200 MG tablet Take 1 tablet (200 mg total) by mouth 3 (three) times daily. 09/17/14   Liam Graham, PA-C  tinidazole (TINDAMAX) 500 MG tablet Take four tablets (2 grams) today and then take 4 tablets (2 grams) tomorrow. 05/17/14   Elveria Rising, MD   BP 115/80 mmHg  Pulse 75  Temp(Src) 98.1 F (36.7 C) (Oral)  Resp 16  SpO2 100% Physical Exam  Constitutional: She is oriented to person, place, and time. Vital signs are normal. She appears well-developed and well-nourished. No distress.  HENT:  Head: Normocephalic and atraumatic.  Cardiovascular: Normal rate, regular rhythm and normal heart sounds.   Pulmonary/Chest: Effort normal and breath sounds normal. No respiratory distress. She exhibits no tenderness.  Abdominal: Soft. Normal appearance and bowel sounds are normal. There is no hepatosplenomegaly. There is tenderness in the epigastric area and left upper quadrant. There is CVA tenderness (right). There is no rigidity, no rebound, no guarding, no tenderness at McBurney's point and negative Murphy's sign.   Neurological: She is alert and oriented to person, place, and time. She has normal strength. Coordination normal.  Skin: Skin is warm and dry. No rash noted. She is not diaphoretic.  Psychiatric: She has a normal mood and affect. Judgment normal.  Nursing note and vitals reviewed.   ED Course  ED EKG  Date/Time: 09/17/2014 10:32 AM Performed by: Allena Katz, H Authorized by: Allena Katz, H Rhythm: sinus rhythm Rate: normal QRS axis: normal Conduction: conduction normal ST Segments: ST segments normal T Waves: T waves normal Other: no other findings Clinical impression: normal ECG   (including critical care time) Labs Review Labs Reviewed  POCT URINALYSIS DIP (DEVICE) - Abnormal; Notable for the following:    Hgb urine dipstick LARGE (*)    Protein, ur 30 (*)    Nitrite POSITIVE (*)    Leukocytes, UA LARGE (*)    All other components within normal limits  URINE CULTURE    Imaging Review No results found.   MDM   1. Pyelonephritis   2. Gastritis   3. Other chest pain    Probable early pyelonephritis and gastritis.  Treat for both, referred to cardiology for chest pain, instructed to go to ED if chest pain returns.     Meds ordered this encounter  Medications  . cefTRIAXone (ROCEPHIN) injection 1 g    Sig:   . ciprofloxacin (CIPRO) 500 MG tablet    Sig: Take 1 tablet (500 mg total) by mouth every 12 (twelve) hours.    Dispense:  20 tablet    Refill:  0  . omeprazole (PRILOSEC) 40 MG capsule    Sig: Take 1 capsule (40 mg total) by mouth daily.    Dispense:  30 capsule    Refill:  2  . phenazopyridine (PYRIDIUM) 200 MG tablet    Sig: Take 1 tablet (200 mg total) by mouth 3 (three) times daily.    Dispense:  6 tablet    Refill:  0       Liam Graham, PA-C 09/17/14 1044

## 2014-09-17 NOTE — ED Notes (Signed)
Multiple complaints: c/o right lower back pain, burning with urination and noticed blood in urine today.   Patient reports over the past week having intermittent sharp chest pain like indigestion, pain in arm.  No sob, no nausea, no vomiting.  No pain currrently

## 2014-09-17 NOTE — Discharge Instructions (Signed)
Chest Pain (Nonspecific) °It is often hard to give a specific diagnosis for the cause of chest pain. There is always a chance that your pain could be related to something serious, such as a heart attack or a blood clot in the lungs. You need to follow up with your health care provider for further evaluation. °CAUSES  °· Heartburn. °· Pneumonia or bronchitis. °· Anxiety or stress. °· Inflammation around your heart (pericarditis) or lung (pleuritis or pleurisy). °· A blood clot in the lung. °· A collapsed lung (pneumothorax). It can develop suddenly on its own (spontaneous pneumothorax) or from trauma to the chest. °· Shingles infection (herpes zoster virus). °The chest wall is composed of bones, muscles, and cartilage. Any of these can be the source of the pain. °· The bones can be bruised by injury. °· The muscles or cartilage can be strained by coughing or overwork. °· The cartilage can be affected by inflammation and become sore (costochondritis). °DIAGNOSIS  °Lab tests or other studies may be needed to find the cause of your pain. Your health care provider may have you take a test called an ambulatory electrocardiogram (ECG). An ECG records your heartbeat patterns over a 24-hour period. You may also have other tests, such as: °· Transthoracic echocardiogram (TTE). During echocardiography, sound waves are used to evaluate how blood flows through your heart. °· Transesophageal echocardiogram (TEE). °· Cardiac monitoring. This allows your health care provider to monitor your heart rate and rhythm in real time. °· Holter monitor. This is a portable device that records your heartbeat and can help diagnose heart arrhythmias. It allows your health care provider to track your heart activity for several days, if needed. °· Stress tests by exercise or by giving medicine that makes the heart beat faster. °TREATMENT  °· Treatment depends on what may be causing your chest pain. Treatment may include: °· Acid blockers for  heartburn. °· Anti-inflammatory medicine. °· Pain medicine for inflammatory conditions. °· Antibiotics if an infection is present. °· You may be advised to change lifestyle habits. This includes stopping smoking and avoiding alcohol, caffeine, and chocolate. °· You may be advised to keep your head raised (elevated) when sleeping. This reduces the chance of acid going backward from your stomach into your esophagus. °Most of the time, nonspecific chest pain will improve within 2-3 days with rest and mild pain medicine.  °HOME CARE INSTRUCTIONS  °· If antibiotics were prescribed, take them as directed. Finish them even if you start to feel better. °· For the next few days, avoid physical activities that bring on chest pain. Continue physical activities as directed. °· Do not use any tobacco products, including cigarettes, chewing tobacco, or electronic cigarettes. °· Avoid drinking alcohol. °· Only take medicine as directed by your health care provider. °· Follow your health care provider's suggestions for further testing if your chest pain does not go away. °· Keep any follow-up appointments you made. If you do not go to an appointment, you could develop lasting (chronic) problems with pain. If there is any problem keeping an appointment, call to reschedule. °SEEK MEDICAL CARE IF:  °· Your chest pain does not go away, even after treatment. °· You have a rash with blisters on your chest. °· You have a fever. °SEEK IMMEDIATE MEDICAL CARE IF:  °· You have increased chest pain or pain that spreads to your arm, neck, jaw, back, or abdomen. °· You have shortness of breath. °· You have an increasing cough, or you cough   up blood.  You have severe back or abdominal pain.  You feel nauseous or vomit.  You have severe weakness.  You faint.  You have chills. This is an emergency. Do not wait to see if the pain will go away. Get medical help at once. Call your local emergency services (911 in [place] Do not drive  yourself to the hospital. MAKE SURE YOU:   Understand these instructions.  Will watch your condition.  Will get help right away if you are not doing well or get worse. Document Released: 05/06/2005 Document Revised: 08/01/2013 Document Reviewed: 03/01/2008 [facility] Patient Information 2015 [place], [place]. This information is not intended to replace advice given to you by your health care provider. Make sure you discuss any questions you have with your health care provider.  Gastritis, Adult Gastritis is soreness and swelling (inflammation) of the lining of the stomach. Gastritis can develop as a sudden onset (acute) or long-term (chronic) condition. If gastritis is not treated, it can lead to stomach bleeding and ulcers. CAUSES  Gastritis occurs when the stomach lining is weak or damaged. Digestive juices from the stomach then inflame the weakened stomach lining. The stomach lining may be weak or damaged due to viral or bacterial infections. One common bacterial infection is the Helicobacter pylori infection. Gastritis can also result from excessive alcohol consumption, taking certain medicines, or having too much acid in the stomach.  SYMPTOMS  In some cases, there are no symptoms. When symptoms are present, they may include:  Pain or a burning sensation in the upper abdomen.  Nausea.  Vomiting.  An uncomfortable feeling of fullness after eating. DIAGNOSIS  Your caregiver may suspect you have gastritis based on your symptoms and a physical exam. To determine the cause of your gastritis, your caregiver may perform the following:  Blood or stool tests to check for the H pylori bacterium.  Gastroscopy. A thin, flexible tube (endoscope) is passed down the esophagus and into the stomach. The endoscope has a light and camera on the end. Your caregiver uses the endoscope to view the inside of the stomach.  Taking a tissue sample (biopsy) from the stomach to examine under a  microscope. TREATMENT  Depending on the cause of your gastritis, medicines may be prescribed. If you have a bacterial infection, such as an H pylori infection, antibiotics may be given. If your gastritis is caused by too much acid in the stomach, H2 blockers or antacids may be given. Your caregiver may recommend that you stop taking aspirin, ibuprofen, or other nonsteroidal anti-inflammatory drugs (NSAIDs). HOME CARE INSTRUCTIONS  Only take over-the-counter or prescription medicines as directed by your caregiver.  If you were given antibiotic medicines, take them as directed. Finish them even if you start to feel better.  Drink enough fluids to keep your urine clear or pale yellow.  Avoid foods and drinks that make your symptoms worse, such as:  Caffeine or alcoholic drinks.  Chocolate.  Peppermint or mint flavorings.  Garlic and onions.  Spicy foods.  Citrus fruits, such as oranges, lemons, or limes.  Tomato-based foods such as sauce, chili, salsa, and pizza.  Fried and fatty foods.  Eat small, frequent meals instead of large meals. SEEK IMMEDIATE MEDICAL CARE IF:   You have black or dark red stools.  You vomit blood or material that looks like coffee grounds.  You are unable to keep fluids down.  Your abdominal pain gets worse.  You have a fever.  You do not feel better  after 1 week.  You have any other questions or concerns. MAKE SURE YOU:  Understand these instructions.  Will watch your condition.  Will get help right away if you are not doing well or get worse. Document Released: 07/21/2001 Document Revised: 01/26/2012 Document Reviewed: 09/09/2011 [facility] Patient Information 2015 [place], [place]. This information is not intended to replace advice given to you by your health care provider. Make sure you discuss any questions you have with your health care provider.  Pyelonephritis, Adult Pyelonephritis is a kidney infection. In general, there are 2 main  types of pyelonephritis:  Infections that come on quickly without any warning (acute pyelonephritis).  Infections that persist for a long period of time (chronic pyelonephritis). CAUSES  Two main causes of pyelonephritis are:  Bacteria traveling from the bladder to the kidney. This is a problem especially in pregnant women. The urine in the bladder can become filled with bacteria from multiple causes, including:  Inflammation of the prostate gland (prostatitis).  Sexual intercourse in females.  Bladder infection (cystitis).  Bacteria traveling from the bloodstream to the tissue part of the kidney. Problems that may increase your risk of getting a kidney infection include:  Diabetes.  Kidney stones or bladder stones.  Cancer.  Catheters placed in the bladder.  Other abnormalities of the kidney or ureter. SYMPTOMS   Abdominal pain.  Pain in the side or flank area.  Fever.  Chills.  Upset stomach.  Blood in the urine (dark urine).  Frequent urination.  Strong or persistent urge to urinate.  Burning or stinging when urinating. DIAGNOSIS  Your caregiver may diagnose your kidney infection based on your symptoms. A urine sample may also be taken. TREATMENT  In general, treatment depends on how severe the infection is.   If the infection is mild and caught early, your caregiver may treat you with oral antibiotics and send you home.  If the infection is more severe, the bacteria may have gotten into the bloodstream. This will require intravenous (IV) antibiotics and a hospital stay. Symptoms may include:  High fever.  Severe flank pain.  Shaking chills.  Even after a hospital stay, your caregiver may require you to be on oral antibiotics for a period of time.  Other treatments may be required depending upon the cause of the infection. HOME CARE INSTRUCTIONS   Take your antibiotics as directed. Finish them even if you start to feel better.  Make an  appointment to have your urine checked to make sure the infection is gone.  Drink enough fluids to keep your urine clear or pale yellow.  Take medicines for the bladder if you have urgency and frequency of urination as directed by your caregiver. SEEK IMMEDIATE MEDICAL CARE IF:   You have a fever or persistent symptoms for more than 2-3 days.  You have a fever and your symptoms suddenly get worse.  You are unable to take your antibiotics or fluids.  You develop shaking chills.  You experience extreme weakness or fainting.  There is no improvement after 2 days of treatment. MAKE SURE YOU:  Understand these instructions.  Will watch your condition.  Will get help right away if you are not doing well or get worse. Document Released: 07/27/2005 Document Revised: 01/26/2012 Document Reviewed: 12/31/2010 [facility] Patient Information 2015 [place], [place]. This information is not intended to replace advice given to you by your health care provider. Make sure you discuss any questions you have with your health care provider.

## 2014-09-19 LAB — URINE CULTURE: Colony Count: >=100000

## 2014-09-19 NOTE — ED Notes (Signed)
Urine culture: >100,000 colonies E. Coli.  Pt. adequately treated with Cipro. Roselyn Meier 09/19/2014

## 2014-10-24 ENCOUNTER — Ambulatory Visit (INDEPENDENT_AMBULATORY_CARE_PROVIDER_SITE_OTHER): Payer: BLUE CROSS/BLUE SHIELD | Admitting: Certified Nurse Midwife

## 2014-10-24 ENCOUNTER — Encounter: Payer: Self-pay | Admitting: Certified Nurse Midwife

## 2014-10-24 VITALS — BP 110/70 | HR 72 | Temp 98.1°F | Resp 16 | Ht 66.0 in | Wt 167.0 lb

## 2014-10-24 DIAGNOSIS — N760 Acute vaginitis: Secondary | ICD-10-CM

## 2014-10-24 DIAGNOSIS — A499 Bacterial infection, unspecified: Secondary | ICD-10-CM

## 2014-10-24 DIAGNOSIS — N390 Urinary tract infection, site not specified: Secondary | ICD-10-CM | POA: Diagnosis not present

## 2014-10-24 DIAGNOSIS — N9489 Other specified conditions associated with female genital organs and menstrual cycle: Secondary | ICD-10-CM

## 2014-10-24 DIAGNOSIS — N898 Other specified noninflammatory disorders of vagina: Secondary | ICD-10-CM

## 2014-10-24 DIAGNOSIS — B9689 Other specified bacterial agents as the cause of diseases classified elsewhere: Secondary | ICD-10-CM

## 2014-10-24 LAB — POCT URINALYSIS DIPSTICK
Bilirubin, UA: NEGATIVE
Blood, UA: NEGATIVE
Glucose, UA: NEGATIVE
Ketones, UA: NEGATIVE
Leukocytes, UA: NEGATIVE
Nitrite, UA: NEGATIVE
Protein, UA: NEGATIVE
Urobilinogen, UA: NEGATIVE
pH, UA: 5

## 2014-10-24 MED ORDER — NITROFURANTOIN MONOHYD MACRO 100 MG PO CAPS: 100.0000 mg | ORAL_CAPSULE | Freq: Two times a day (BID) | ORAL | Status: DC

## 2014-10-24 MED ORDER — METRONIDAZOLE 0.75 % VA GEL: VAGINAL | Status: DC

## 2014-10-24 NOTE — Progress Notes (Signed)
34 y.o.Single african female [id number] here with complaint of vaginal symptoms of  and increase discharge with odor.Marland Kitchen Describes discharge as odorous and clear discharge. Onset of symptoms 4 days ago. Denies new personal products.No STD concerns. Urinary symptoms frequency,urgency and pain feels related to sexual activity. Denies fever or chills or nausea, slight back pain. Started at same time vaginal symptoms started. Contraception is tubal. No other health issues.   O:Healthy female WDWN Affect: normal, orientation x 3  Exam:Skin warm and dry Positive CVAT left only Abdomen:positive suprapubic Lymph node: no enlargement or tenderness Pelvic exam: External genital: normal female, no lesions BUS: negative Bladder tender, urethral meatus tender Vagina: clear watery odorous discharge noted. Ph: 5.0  ,Wet prep taken, Cervix: normal, non tender, no CMT Uterus: normal, non tender Adnexa:normal, non tender, no masses or fullness noted   Wet Prep results: positive for clue   A:Normal pelvic exam UTI BV  P:Discussed findings of  BV and UTI. Discussed Aveeno or baking soda sitz bath for comfort. Avoid moist clothes or pads for extended period of time. If working out in gym clothes or swim suits for long periods of time change underwear or bottoms of swimsuit if possible. Olive Oil/Coconut Oil use for skin protection prior to activity can be used to external skin. Rx: Metrogel see order Discussed warning signs of UTI and need to advise. Increase water, avoid coffee, tea or soda. Rx Macrobid 100 mg see order. Lab urine culture  Rv 2 weeks TOC for BV due to chronic infection. Plan to do affirm

## 2014-10-24 NOTE — Patient Instructions (Signed)
Bacterial Vaginosis Bacterial vaginosis is a vaginal infection that occurs when the normal balance of bacteria in the vagina is disrupted. It results from an overgrowth of certain bacteria. This is the most common vaginal infection in women of childbearing age. Treatment is important to prevent complications, especially in pregnant women, as it can cause a premature delivery. CAUSES  Bacterial vaginosis is caused by an increase in harmful bacteria that are normally present in smaller amounts in the vagina. Several different kinds of bacteria can cause bacterial vaginosis. However, the reason that the condition develops is not fully understood. RISK FACTORS Certain activities or behaviors can put you at an increased risk of developing bacterial vaginosis, including:  Having a new sex partner or multiple sex partners.  Douching.  Using an intrauterine device (IUD) for contraception. Women do not get bacterial vaginosis from toilet seats, bedding, swimming pools, or contact with objects around them. SIGNS AND SYMPTOMS  Some women with bacterial vaginosis have no signs or symptoms. Common symptoms include:  Grey vaginal discharge.  A fishlike odor with discharge, especially after sexual intercourse.  Itching or burning of the vagina and vulva.  Burning or pain with urination. DIAGNOSIS  Your health care provider will take a medical history and examine the vagina for signs of bacterial vaginosis. A sample of vaginal fluid may be taken. Your health care provider will look at this sample under a microscope to check for bacteria and abnormal cells. A vaginal pH test may also be done.  TREATMENT  Bacterial vaginosis may be treated with antibiotic medicines. These may be given in the form of a pill or a vaginal cream. A second round of antibiotics may be prescribed if the condition comes back after treatment.  HOME CARE INSTRUCTIONS   Only take over-the-counter or prescription medicines as  directed by your health care provider.  If antibiotic medicine was prescribed, take it as directed. Make sure you finish it even if you start to feel better.  Do not have sex until treatment is completed.  Tell all sexual partners that you have a vaginal infection. They should see their health care provider and be treated if they have problems, such as a mild rash or itching.  Practice safe sex by using condoms and only having one sex partner. SEEK MEDICAL CARE IF:   Your symptoms are not improving after 3 days of treatment.  You have increased discharge or pain.  You have a fever. MAKE SURE YOU:   Understand these instructions.  Will watch your condition.  Will get help right away if you are not doing well or get worse. FOR MORE INFORMATION  Centers for Disease Control and Prevention, Division of STD Prevention: www.cdc.gov/std American Sexual Health Association (ASHA): www.ashastd.org  Document Released: 07/27/2005 Document Revised: 05/17/2013 Document Reviewed: 03/08/2013 ExitCare Patient Information 2015 [employer]. This information is not intended to replace advice given to you by your health care provider. Make sure you discuss any questions you have with your health care provider.  

## 2014-10-25 LAB — URINE CULTURE: Colony Count: 70000

## 2014-10-25 NOTE — Progress Notes (Signed)
Reviewed personally.  M. Suzanne Yalena Colon, MD.  

## 2014-11-08 ENCOUNTER — Ambulatory Visit: Payer: BLUE CROSS/BLUE SHIELD | Admitting: Certified Nurse Midwife

## 2014-11-16 ENCOUNTER — Telehealth: Payer: Self-pay | Admitting: Certified Nurse Midwife

## 2014-11-16 ENCOUNTER — Ambulatory Visit: Payer: BLUE CROSS/BLUE SHIELD | Admitting: Certified Nurse Midwife

## 2014-11-16 NOTE — Telephone Encounter (Signed)
Patient canceled her 2 week reck. Patient is at work and will not be able to leave early from work. She said she is very sorry. Patient rescheduled to 11/22/14 @ 4:00pm.

## 2014-11-22 ENCOUNTER — Ambulatory Visit: Payer: BLUE CROSS/BLUE SHIELD | Admitting: Certified Nurse Midwife

## 2014-11-22 ENCOUNTER — Telehealth: Payer: Self-pay | Admitting: Certified Nurse Midwife

## 2014-11-22 NOTE — Telephone Encounter (Signed)
Patient called and cancelled her 2 week recheck with Melvia Heaps, CNM for today due to her "daughter is at the doctor" and she needs to be with her. She rescheduled to 12/03/14.

## 2014-12-03 ENCOUNTER — Encounter: Payer: Self-pay | Admitting: Certified Nurse Midwife

## 2014-12-03 ENCOUNTER — Ambulatory Visit (INDEPENDENT_AMBULATORY_CARE_PROVIDER_SITE_OTHER): Payer: BLUE CROSS/BLUE SHIELD | Admitting: Certified Nurse Midwife

## 2014-12-03 VITALS — BP 110/70 | HR 72 | Temp 97.3°F | Resp 16 | Ht 66.0 in | Wt 161.0 lb

## 2014-12-03 DIAGNOSIS — Z113 Encounter for screening for infections with a predominantly sexual mode of transmission: Secondary | ICD-10-CM | POA: Diagnosis not present

## 2014-12-03 DIAGNOSIS — N390 Urinary tract infection, site not specified: Secondary | ICD-10-CM

## 2014-12-03 DIAGNOSIS — N898 Other specified noninflammatory disorders of vagina: Secondary | ICD-10-CM

## 2014-12-03 LAB — POCT URINALYSIS DIPSTICK
Bilirubin, UA: NEGATIVE
Blood, UA: NEGATIVE
Glucose, UA: NEGATIVE
Ketones, UA: NEGATIVE
Leukocytes, UA: NEGATIVE
Nitrite, UA: NEGATIVE
Protein, UA: NEGATIVE
Urobilinogen, UA: NEGATIVE
pH, UA: 5

## 2014-12-03 MED ORDER — NITROFURANTOIN MONOHYD MACRO 100 MG PO CAPS: 100.0000 mg | ORAL_CAPSULE | Freq: Two times a day (BID) | ORAL | Status: DC

## 2014-12-03 NOTE — Progress Notes (Signed)
34 y.o.Single  African [place] female [id number]  here with complaint of ? UTI, with onset  on  2 days ago.. Patient complaining of urinary frequency/urgency with urination. Patient denies fever, chills, nausea or back pain. No new personal products. Patient feels related to sexual activity. Complaining of  vaginal symptoms of discharge only and no itching.  Patient  Is having adequate water intake. Desires STD screening. Contraception BTL.   O: Healthy female WDWN Affect: Normal, orientation x 3 Skin : warm and dry CVAT:negative bilateral Abdomen: positive for suprapubic tenderness  Pelvic exam: External genital area: normal, no lesions Bladder,Urethra tender, Urethral meatus: tender, red Vagina: normal vaginal discharge, normal appearance Affirm taken Cervix: normal, non tender Uterus:normal,non tender Adnexa: normal non tender, no fullness or masses   A: UTI Normal pelvic exam R/O vaginal infection, history of chronic BV STD screening  P: Reviewed findings of UTI and need for treatment. YH:[id number] see order BTD:VVOHY micro, culture Reviewed warning signs and symptoms of UTI and need to advise if occurring. Encouraged to limit soda, tea, and coffee Await affirm results, treat if indicated Lab GC.   Stressed condom use.     RV prn

## 2014-12-03 NOTE — Patient Instructions (Signed)

## 2014-12-04 ENCOUNTER — Telehealth: Payer: Self-pay

## 2014-12-04 ENCOUNTER — Other Ambulatory Visit: Payer: Self-pay | Admitting: Certified Nurse Midwife

## 2014-12-04 DIAGNOSIS — N760 Acute vaginitis: Principal | ICD-10-CM

## 2014-12-04 DIAGNOSIS — B9689 Other specified bacterial agents as the cause of diseases classified elsewhere: Secondary | ICD-10-CM

## 2014-12-04 LAB — WET PREP BY MOLECULAR PROBE
Candida species: NEGATIVE
Gardnerella vaginalis: POSITIVE — AB
Trichomonas vaginosis: NEGATIVE

## 2014-12-04 MED ORDER — HYLAFEM VA SUPP: 1.0000 | Freq: Every day | VAGINAL | Status: DC

## 2014-12-04 NOTE — Telephone Encounter (Signed)
-----   Message from Regina Eck, CNM sent at 12/04/2014  7:50 AM EDT ----- Notify patient that Affirm was positive for BV, negative for yeast and trichomonas Discussed with her at visit if positive would like to use the Hylafem and that it would need be ordered at her pharmacy and would take a few days. She was [facility] with waiting. I would like her to clear her UTI and then start vaginal suppository. Order in

## 2014-12-04 NOTE — Telephone Encounter (Signed)
lmtcb

## 2014-12-04 NOTE — Telephone Encounter (Signed)
Patient notified of results. See lab 

## 2014-12-04 NOTE — Progress Notes (Signed)
Reviewed personally.  M. Suzanne Sheray Grist, MD.  

## 2014-12-05 LAB — URINE CULTURE: Colony Count: >=100000

## 2014-12-05 LAB — IPS N GONORRHOEA AND CHLAMYDIA BY PCR

## 2015-01-13 ENCOUNTER — Emergency Department (HOSPITAL_COMMUNITY)
Admission: EM | Admit: 2015-01-13 | Discharge: 2015-01-13 | Disposition: A | Discharge status: Home / Self Care | Payer: BLUE CROSS/BLUE SHIELD | Attending: Emergency Medicine | Admitting: Emergency Medicine

## 2015-01-13 ENCOUNTER — Encounter (HOSPITAL_COMMUNITY): Payer: Self-pay | Admitting: Emergency Medicine

## 2015-01-13 ENCOUNTER — Emergency Department (HOSPITAL_COMMUNITY): Payer: BLUE CROSS/BLUE SHIELD

## 2015-01-13 DIAGNOSIS — Z862 Personal history of diseases of the blood and blood-forming organs and certain disorders involving the immune mechanism: Secondary | ICD-10-CM | POA: Insufficient documentation

## 2015-01-13 DIAGNOSIS — S4991XA Unspecified injury of right shoulder and upper arm, initial encounter: Secondary | ICD-10-CM | POA: Diagnosis present

## 2015-01-13 DIAGNOSIS — Y9389 Activity, other specified: Secondary | ICD-10-CM | POA: Insufficient documentation

## 2015-01-13 DIAGNOSIS — Z79899 Other long term (current) drug therapy: Secondary | ICD-10-CM | POA: Diagnosis not present

## 2015-01-13 DIAGNOSIS — Y9222 Religious institution as the place of occurrence of the external cause: Secondary | ICD-10-CM | POA: Diagnosis not present

## 2015-01-13 DIAGNOSIS — X58XXXA Exposure to other specified factors, initial encounter: Secondary | ICD-10-CM | POA: Insufficient documentation

## 2015-01-13 DIAGNOSIS — Z87448 Personal history of other diseases of urinary system: Secondary | ICD-10-CM | POA: Diagnosis not present

## 2015-01-13 DIAGNOSIS — S43004A Unspecified dislocation of right shoulder joint, initial encounter: Secondary | ICD-10-CM | POA: Insufficient documentation

## 2015-01-13 DIAGNOSIS — Y998 Other external cause status: Secondary | ICD-10-CM | POA: Diagnosis not present

## 2015-01-13 MED ORDER — HYDROMORPHONE HCL 1 MG/ML IJ SOLN
1.0000 mg | Freq: Once | INTRAMUSCULAR | Status: AC
  Administered 2015-01-13: 1 mg via INTRAVENOUS
  Filled 2015-01-13: qty 1

## 2015-01-13 MED ORDER — DIAZEPAM 5 MG/ML IJ SOLN
5.0000 mg | Freq: Once | INTRAMUSCULAR | Status: AC
  Administered 2015-01-13: 5 mg via INTRAVENOUS
  Filled 2015-01-13: qty 2

## 2015-01-13 NOTE — ED Notes (Signed)
Patient states she was shouting out at church, she had her arms above her head.  Right shoulder pain, shoulder feels like it is out of place.  Patient states this has happened before, x4.

## 2015-01-13 NOTE — Discharge Instructions (Signed)
Shoulder Dislocation  Your shoulder is made up of three bones: the collar bone (clavicle); the shoulder blade (scapula), which includes the socket (glenoid cavity); and the upper arm bone (humerus). Your shoulder joint is the place where these bones meet. Strong, fibrous tissues hold these bones together (ligaments). Muscles and strong, fibrous tissues that connect the muscles to these bones (tendons) allow your arm to move through this joint. The range of motion of your shoulder joint is more extensive than most of your other joints, and the glenoid cavity is very shallow. That is the reason that your shoulder joint is one of the most unstable joints in your body. It is far more prone to dislocation than your other joints. Shoulder dislocation is when your humerus is forced out of your shoulder joint.  CAUSES  Shoulder dislocation is caused by a forceful impact on your shoulder. This impact usually is from an injury, such as a sports injury or a fall.  SYMPTOMS  Symptoms of shoulder dislocation include:  · Deformity of your shoulder.  · Intense pain.  · Inability to move your shoulder joint.  · Numbness, weakness, or tingling around your shoulder joint (your neck or down your arm).  · Bruising or swelling around your shoulder.  DIAGNOSIS  In order to diagnose a dislocated shoulder, your caregiver will perform a physical exam. Your caregiver also may have an X-ray exam done to see if you have any broken bones. Magnetic resonance imaging (MRI) is a procedure that sometimes is done to help your caregiver see any damage to the soft tissues around your shoulder, particularly your rotator cuff tendons. Additionally, your caregiver also may have electromyography done to measure the electrical discharges produced in your muscles if you have signs or symptoms of nerve damage.  TREATMENT  A shoulder dislocation is treated by placing the humerus back in the joint (reduction). Your caregiver does this either manually (closed  reduction), by moving your humerus back into the joint through manipulation, or through surgery (open reduction). When your humerus is back in place, severe pain should improve almost immediately.  You also may need to have surgery if you have a weak shoulder joint or ligaments, and you have recurring shoulder dislocations, despite rehabilitation. In rare cases, surgery is necessary if your nerves or blood vessels are damaged during the dislocation.  After your reduction, your arm will be placed in a shoulder immobilizer or sling to keep it from moving. Your caregiver will have you wear your shoulder immobilizer or sling for 3 days to 3 weeks, depending on how serious your dislocation is. When your shoulder immobilizer or sling is removed, your caregiver may prescribe physical therapy to help improve the range of motion in your shoulder joint.  HOME CARE INSTRUCTIONS   The following measures can help to reduce pain and speed up the healing process:  · Rest your injured joint. Do not move it. Avoid activities similar to the one that caused your injury.  · Apply ice to your injured joint for the first day or two after your reduction or as directed by your caregiver. Applying ice helps to reduce inflammation and pain.  ¨ Put ice in a plastic bag.  ¨ Place a towel between your skin and the bag.  ¨ Leave the ice on for 15-20 minutes at a time, every 2 hours while you are awake.  · Exercise your hand by squeezing a soft ball. This helps to eliminate stiffness and swelling in your hand and   wrist.  · Take over-the-counter or prescription medicine for pain or discomfort as told by your caregiver.  SEEK IMMEDIATE MEDICAL CARE IF:   · Your shoulder immobilizer or sling becomes damaged.  · Your pain becomes worse rather than better.  · You lose feeling in your arm or hand, or they become white and cold.  MAKE SURE YOU:   · Understand these instructions.  · Will watch your condition.  · Will get help right away if you are not  doing well or get worse.  Document Released: 04/21/2001 Document Revised: 12/11/2013 Document Reviewed: 05/17/2011  ExitCare® Patient Information ©2015 Exit[employer], [employer]. This information is not intended to replace advice given to you by your health care provider. Make sure you discuss any questions you have with your health care provider.

## 2015-01-13 NOTE — ED Notes (Signed)
Patient transported to X-ray 

## 2015-01-13 NOTE — ED Provider Notes (Signed)
CSN: [id number]     Arrival date & time 01/13/15  1852 History   First MD Initiated Contact with Patient 01/13/15 1928     Chief Complaint  Patient presents with  . Shoulder Injury     (Consider location/radiation/quality/duration/timing/severity/associated sxs/prior Treatment) Patient is a 34 y.o. female presenting with shoulder injury.  Shoulder Injury This is a new problem. The current episode started 1 to 2 hours ago. The problem occurs constantly. The problem has not changed since onset.Pertinent negatives include no chest pain, no abdominal pain, no headaches and no shortness of breath. Nothing aggravates the symptoms. Nothing relieves the symptoms. She has tried nothing for the symptoms.    Past Medical History  Diagnosis Date  . No pertinent past medical history   . Anemia   . Dyspareunia    Past Surgical History  Procedure Laterality Date  . Tubal ligation  6 years ago    Family History  Problem Relation Age of Onset  . Diabetes Mellitus I Daughter    History  Substance Use Topics  . Smoking status: Never Smoker   . Smokeless tobacco: Never Used  . Alcohol Use: No   OB History    Gravida Para Term Preterm AB TAB SAB Ectopic Multiple Living   5 5 5       5      Review of Systems  Respiratory: Negative for shortness of breath.   Cardiovascular: Negative for chest pain.  Gastrointestinal: Negative for abdominal pain.  Neurological: Negative for headaches.  All other systems reviewed and are negative.     Allergies  Review of patient's allergies indicates no known allergies.  Home Medications   Prior to Admission medications   Medication Sig Start Date End Date Taking? Authorizing Provider  Homeopathic Products (HYLAFEM) SUPP Place 1 suppository vaginally at bedtime. 12/04/14   Regina Eck, CNM  IRON PO Take by mouth as needed.    Historical Provider, MD  nitrofurantoin, macrocrystal-monohydrate, (MACROBID) 100 MG capsule Take 1 capsule (100 mg  total) by mouth 2 (two) times daily. 12/03/14   Regina Eck, CNM  omeprazole (PRILOSEC) 40 MG capsule Take 1 capsule (40 mg total) by mouth daily. 09/17/14   Freeman Caldron Baker, PA-C   BP 106/77 mmHg  Pulse 88  Temp(Src) 97.8 F (36.6 C) (Oral)  Resp 20  Ht 5\' 6"  (1.676 m)  Wt 167 lb (75.751 kg)  BMI 26.97 kg/m2  SpO2 97%  LMP 12/28/2014 (Approximate) Physical Exam  Constitutional: She is oriented to person, place, and time. She appears well-developed and well-nourished.  HENT:  Head: Normocephalic and atraumatic.  Right Ear: External ear normal.  Left Ear: External ear normal.  Eyes: Conjunctivae and EOM are normal. Pupils are equal, round, and reactive to light.  Neck: Normal range of motion. Neck supple.  Cardiovascular: Normal rate, regular rhythm, normal heart sounds and intact distal pulses.   Pulmonary/Chest: Effort normal and breath sounds normal.  Abdominal: Soft. Bowel sounds are normal. There is no tenderness.  Musculoskeletal: Normal range of motion.  R shoulder dislocated  Neurological: She is alert and oriented to person, place, and time.  Skin: Skin is warm and dry.  Vitals reviewed.   ED Course  Reduction of dislocation Date/Time: 01/13/2015 9:08 PM Performed by: Debby Freiberg Authorized by: Debby Freiberg Consent: Verbal consent obtained. Risks and benefits: risks, benefits and alternatives were discussed Local anesthesia used: no Patient sedated: no Patient tolerance: Patient tolerated the procedure well with no immediate complications Comments:  R shoulder   (including critical care time) Labs Review Labs Reviewed - No data to display  Imaging Review Dg Shoulder Right  01/13/2015   CLINICAL DATA:  Dislocated shoulder at church today. Postreduction films.  EXAM: RIGHT SHOULDER - 2+ VIEW  COMPARISON:  08/19/2013  FINDINGS: The joint spaces are maintained. No fracture or dislocation. The right lung is clear.  IMPRESSION: No fracture or dislocation.    Electronically Signed   By: Marijo Sanes M.D.   On: 01/13/2015 20:56     EKG Interpretation None      MDM   Final diagnoses:  Shoulder dislocation, right, initial encounter    34 y.o. female with pertinent PMH of prior shoulder dislocation presents with recurrent R shoulder dislocation.  Exam as above, pulses intact.  Reduced as above, uneventfully using dilaudid and valium.  Relief of pain and post reduction films unremarkable.  DC home to fu with orthopedics in sling.  Standard return precautions given.    I have reviewed all laboratory and imaging studies if ordered as above  1. Shoulder dislocation, right, initial encounter         Debby Freiberg, MD 01/13/15 2108

## 2015-05-02 ENCOUNTER — Ambulatory Visit (INDEPENDENT_AMBULATORY_CARE_PROVIDER_SITE_OTHER): Payer: BLUE CROSS/BLUE SHIELD | Admitting: Certified Nurse Midwife

## 2015-05-02 ENCOUNTER — Encounter: Payer: Self-pay | Admitting: Certified Nurse Midwife

## 2015-05-02 VITALS — BP 102/66 | HR 70 | Resp 16 | Ht 66.0 in | Wt 163.0 lb

## 2015-05-02 DIAGNOSIS — Z0000 Encounter for general adult medical examination without abnormal findings: Secondary | ICD-10-CM | POA: Diagnosis not present

## 2015-05-02 DIAGNOSIS — N898 Other specified noninflammatory disorders of vagina: Secondary | ICD-10-CM | POA: Diagnosis not present

## 2015-05-02 DIAGNOSIS — Z01419 Encounter for gynecological examination (general) (routine) without abnormal findings: Secondary | ICD-10-CM | POA: Diagnosis not present

## 2015-05-02 LAB — POCT URINALYSIS DIPSTICK
Bilirubin, UA: NEGATIVE
Blood, UA: NEGATIVE
Glucose, UA: NEGATIVE
Ketones, UA: NEGATIVE
Leukocytes, UA: NEGATIVE
Nitrite, UA: NEGATIVE
Protein, UA: NEGATIVE
Urobilinogen, UA: NEGATIVE
pH, UA: 7

## 2015-05-02 LAB — HEMOGLOBIN A1C
Hgb A1c MFr Bld: 5.3 %
Mean Plasma Glucose: 105 mg/dL

## 2015-05-02 LAB — LIPID PANEL
Cholesterol: 191 mg/dL (ref 125–200)
HDL: 66 mg/dL
LDL Cholesterol: 110 mg/dL
Total CHOL/HDL Ratio: 2.9 ratio
Triglycerides: 75 mg/dL
VLDL: 15 mg/dL

## 2015-05-02 LAB — HEMOGLOBIN, FINGERSTICK: Hemoglobin, fingerstick: 12.9 g/dL (ref 12.0–16.0)

## 2015-05-02 LAB — HIV ANTIBODY (ROUTINE TESTING W REFLEX): HIV 1&2 Ab, 4th Generation: NONREACTIVE

## 2015-05-02 LAB — TSH: TSH: 1.236 u[IU]/mL (ref 0.350–4.500)

## 2015-05-02 NOTE — Progress Notes (Signed)
Reviewed personally.  M. Suzanne Amanii Snethen, MD.  

## 2015-05-02 NOTE — Progress Notes (Signed)
34 y.o. [id number] Single  African American Fe here for annual exam.  Periods normal, but slight increase in amount, but regular, monthly. Patient dealing with dust allergy with work, has mask availability. Partner change, not sexually at this time. Desires STD screening. Has been having episodes of fatigue, almost fell asleep at the wheel. Has been very busy with family. Eating well, but craving sugar at times. Denies urinary frequency, increase thirst or nocturia. Sees urgent care if needed. Would like to have screening labs also today. Planning breast lift in the coming year. No other health concerns today.  Patient's last menstrual period was 04/12/2015.          Sexually active: Yes.    The current method of family planning is tubal ligation.    Exercising: No.  exercise Smoker:  no  Health Maintenance: Pap: 03-21-14 neg HPV HR neg MMG:  none Colonoscopy:  none BMD:   none TDaP:  2014 Labs: poct urine-ph.7, hgb-12.9 Self breast exam: not done   reports that she has never smoked. She has never used smokeless tobacco. She reports that she drinks about 2.4 oz of alcohol per week. She reports that she does not use illicit drugs.  Past Medical History  Diagnosis Date  . No pertinent past medical history   . Anemia   . Dyspareunia     Past Surgical History  Procedure Laterality Date  . Tubal ligation  6 years ago     Current Outpatient Prescriptions  Medication Sig Dispense Refill  . IRON PO Take by mouth as needed.    Marland Kitchen omeprazole (PRILOSEC) 40 MG capsule Take 1 capsule (40 mg total) by mouth daily. 30 capsule 2   No current facility-administered medications for this visit.    Family History  Problem Relation Age of Onset  . Diabetes Mellitus I Daughter     ROS:  Pertinent items are noted in HPI.  Otherwise, a comprehensive ROS was negative.  Exam:   BP 102/66 mmHg  Pulse 70  Resp 16  Ht 5\' 6"  (1.676 m)  Wt 163 lb (73.936 kg)  BMI 26.32 kg/m2  LMP 04/12/2015 Height:  5\' 6"  (167.6 cm) Ht Readings from Last 3 Encounters:  05/02/15 5\' 6"  (1.676 m)  01/13/15 5\' 6"  (1.676 m)  12/03/14 5\' 6"  (1.676 m)    General appearance: alert, cooperative and appears stated age Head: Normocephalic, without obvious abnormality, atraumatic Neck: no adenopathy, supple, symmetrical, trachea midline and thyroid normal to inspection and palpation Lungs: clear to auscultation bilaterally Breasts: normal appearance, no masses or tenderness, No nipple retraction or dimpling, No nipple discharge or bleeding, No axillary or supraclavicular adenopathy, pendulous Heart: regular rate and rhythm Abdomen: soft, non-tender; no masses,  no organomegaly Extremities: extremities normal, atraumatic, no cyanosis or edema Skin: Skin color, texture, turgor normal. No rashes or lesions Lymph nodes: Cervical, supraclavicular, and axillary nodes normal. No abnormal inguinal nodes palpated Neurologic: Grossly normal   Pelvic: External genitalia:  no lesions              Urethra:  normal appearing urethra with no masses, tenderness or lesions              Bartholin's and Skene's: normal                 Vagina: normal appearing vagina with normal color and discharge, no lesions              Cervix: multiparous appearance and normal,non tender, no lesions  Pap taken: No. Bimanual Exam:  Uterus:  normal size, contour, position, consistency, mobility, non-tender and mid position              Adnexa: normal adnexa and no mass, fullness, tenderness               Rectovaginal: Confirms               Anus:  normal sphincter tone, no lesions  Chaperone present: yes  A:  Well Woman with normal exam  Contraception Tubal  Fatigue desires lab screening  STD screening  P:   Reviewed health and wellness pertinent to exam  Discussed importance of adequate diet with balance intake from all food groups. Increase protein in diet to see if this helps with fatigue. Increase nightly sleep if  possible.  Labs: TSH,Lipid panel, Hgb A1-C  Labs HIV,RPR, Chlamydia, GC, Affirm  Pap smear as above not taken   counseled on breast self exam, STD prevention, HIV risk factors and prevention, adequate intake of calcium and vitamin D, diet and exercise  return annually or prn  An After Visit Summary was printed and given to the patient.

## 2015-05-02 NOTE — Patient Instructions (Signed)
EXERCISE AND DIET:  We recommended that you start or continue a regular exercise program for good health. Regular exercise means any activity that makes your heart beat faster and makes you sweat.  We recommend exercising at least 30 minutes per day at least 3 days a week, preferably 4 or 5.  We also recommend a diet low in fat and sugar.  Inactivity, poor dietary choices and obesity can cause diabetes, heart attack, stroke, and kidney damage, among others.    ALCOHOL AND SMOKING:  Women should limit their alcohol intake to no more than 7 drinks/beers/glasses of wine (combined, not each!) per week. Moderation of alcohol intake to this level decreases your risk of breast cancer and liver damage. And of course, no recreational drugs are part of a healthy lifestyle.  And absolutely no smoking or even second hand smoke. Most people know smoking can cause heart and lung diseases, but did you know it also contributes to weakening of your bones? Aging of your skin?  Yellowing of your teeth and nails?  CALCIUM AND VITAMIN D:  Adequate intake of calcium and Vitamin D are recommended.  The recommendations for exact amounts of these supplements seem to change often, but generally speaking 600 mg of calcium (either carbonate or citrate) and 800 units of Vitamin D per day seems prudent. Certain women may benefit from higher intake of Vitamin D.  If you are among these women, your doctor will have told you during your visit.    PAP SMEARS:  Pap smears, to check for cervical cancer or precancers,  have traditionally been done yearly, although recent scientific advances have shown that most women can have pap smears less often.  However, every woman still should have a physical exam from her gynecologist every year. It will include a breast check, inspection of the vulva and vagina to check for abnormal growths or skin changes, a visual exam of the cervix, and then an exam to evaluate the size and shape of the uterus and  ovaries.  And after 34 years of age, a rectal exam is indicated to check for rectal cancers. We will also provide age appropriate advice regarding health maintenance, like when you should have certain vaccines, screening for sexually transmitted diseases, bone density testing, colonoscopy, mammograms, etc.   MAMMOGRAMS:  All women over 40 years old should have a yearly mammogram. Many facilities now offer a "3D" mammogram, which may cost around $50 extra out of pocket. If possible,  we recommend you accept the option to have the 3D mammogram performed.  It both reduces the number of women who will be called back for extra views which then turn out to be normal, and it is better than the routine mammogram at detecting truly abnormal areas.    COLONOSCOPY:  Colonoscopy to screen for colon cancer is recommended for all women at age 50.  We know, you hate the idea of the prep.  We agree, BUT, having colon cancer and not knowing it is worse!!  Colon cancer so often starts as a polyp that can be seen and removed at colonscopy, which can quite literally save your life!  And if your first colonoscopy is normal and you have no family history of colon cancer, most women don't have to have it again for 10 years.  Once every ten years, you can do something that may end up saving your life, right?  We will be happy to help you get it scheduled when you are ready.    Be sure to check your insurance coverage so you understand how much it will cost.  It may be covered as a preventative service at no cost, but you should check your particular policy.     Fatigue Fatigue is a feeling of tiredness, lack of energy, lack of motivation, or feeling tired all the time. Having enough rest, good nutrition, and reducing stress will normally reduce fatigue. Consult your caregiver if it persists. The nature of your fatigue will help your caregiver to find out its cause. The treatment is based on the cause.  CAUSES  There are many causes  for fatigue. Most of the time, fatigue can be traced to one or more of your habits or routines. Most causes fit into one or more of three general areas. They are: Lifestyle problems  Sleep disturbances.  Overwork.  Physical exertion.  Unhealthy habits.  Poor eating habits or eating disorders.  Alcohol and/or drug use .  Lack of proper nutrition (malnutrition). Psychological problems  Stress and/or anxiety problems.  Depression.  Grief.  Boredom. Medical Problems or Conditions  Anemia.  Pregnancy.  Thyroid gland problems.  Recovery from major surgery.  Continuous pain.  Emphysema or asthma that is not well controlled  Allergic conditions.  Diabetes.  Infections (such as mononucleosis).  Obesity.  Sleep disorders, such as sleep apnea.  Heart failure or other heart-related problems.  Cancer.  Kidney disease.  Liver disease.  Effects of certain medicines such as antihistamines, cough and cold remedies, prescription pain medicines, heart and blood pressure medicines, drugs used for treatment of cancer, and some antidepressants. SYMPTOMS  The symptoms of fatigue include:   Lack of energy.  Lack of drive (motivation).  Drowsiness.  Feeling of indifference to the surroundings. DIAGNOSIS  The details of how you feel help guide your caregiver in finding out what is causing the fatigue. You will be asked about your present and past health condition. It is important to review all medicines that you take, including prescription and non-prescription items. A thorough exam will be done. You will be questioned about your feelings, habits, and normal lifestyle. Your caregiver may suggest blood tests, urine tests, or other tests to look for common medical causes of fatigue.  TREATMENT  Fatigue is treated by correcting the underlying cause. For example, if you have continuous pain or depression, treating these causes will improve how you feel. Similarly, adjusting  the dose of certain medicines will help in reducing fatigue.  HOME CARE INSTRUCTIONS   Try to get the required amount of good sleep every night.  Eat a healthy and nutritious diet, and drink enough water throughout the day.  Practice ways of relaxing (including yoga or meditation).  Exercise regularly.  Make plans to change situations that cause stress. Act on those plans so that stresses decrease over time. Keep your work and personal routine reasonable.  Avoid street drugs and minimize use of alcohol.  Start taking a daily multivitamin after consulting your caregiver. SEEK MEDICAL CARE IF:   You have persistent tiredness, which cannot be accounted for.  You have fever.  You have unintentional weight loss.  You have headaches.  You have disturbed sleep throughout the night.  You are feeling sad.  You have constipation.  You have dry skin.  You have gained weight.  You are taking any new or different medicines that you suspect are causing fatigue.  You are unable to sleep at night.  You develop any unusual swelling of your legs or other  parts of your body. SEEK IMMEDIATE MEDICAL CARE IF:   You are feeling confused.  Your vision is blurred.  You feel faint or pass out.  You develop severe headache.  You develop severe abdominal, pelvic, or back pain.  You develop chest pain, shortness of breath, or an irregular or fast heartbeat.  You are unable to pass a normal amount of urine.  You develop abnormal bleeding such as bleeding from the rectum or you vomit blood.  You have thoughts about harming yourself or committing suicide.  You are worried that you might harm someone else. MAKE SURE YOU:   Understand these instructions.  Will watch your condition.  Will get help right away if you are not doing well or get worse. Document Released: 05/24/2007 Document Revised: 10/19/2011 Document Reviewed: 11/28/2013 [facility] Patient Information 2015 [place],  [place]. This information is not intended to replace advice given to you by your health care provider. Make sure you discuss any questions you have with your health care provider.

## 2015-05-03 ENCOUNTER — Telehealth: Payer: Self-pay

## 2015-05-03 ENCOUNTER — Other Ambulatory Visit: Payer: Self-pay | Admitting: Certified Nurse Midwife

## 2015-05-03 DIAGNOSIS — B9689 Other specified bacterial agents as the cause of diseases classified elsewhere: Secondary | ICD-10-CM

## 2015-05-03 DIAGNOSIS — N760 Acute vaginitis: Principal | ICD-10-CM

## 2015-05-03 LAB — WET PREP BY MOLECULAR PROBE
Candida species: NEGATIVE
Gardnerella vaginalis: POSITIVE — AB
Trichomonas vaginosis: NEGATIVE

## 2015-05-03 LAB — RPR

## 2015-05-03 MED ORDER — HYLAFEM VA SUPP: 1.0000 | Freq: Every day | VAGINAL | Status: DC

## 2015-05-03 NOTE — Telephone Encounter (Signed)
Patient returning your call.

## 2015-05-03 NOTE — Telephone Encounter (Signed)
-----   Message from Regina Eck, CNM sent at 05/03/2015  7:57 AM EDT ----- Notify patient that affirm positive for Bv, negative for yeast and trichomonas  Order in for Hylafem give instructions HIV,RPR negative Hgb A1-C is normal 5.3 TSH is normal Lipid panel looks great! Gc,Chlamydia pending

## 2015-05-03 NOTE — Telephone Encounter (Signed)
lmtcb

## 2015-05-03 NOTE — Telephone Encounter (Signed)
Patient returning call.

## 2015-05-03 NOTE — Telephone Encounter (Signed)
Left message to call back  

## 2015-05-03 NOTE — Telephone Encounter (Signed)
Patient notified of results. See lab 

## 2015-05-04 LAB — IPS N GONORRHOEA AND CHLAMYDIA BY PCR

## 2015-05-13 ENCOUNTER — Telehealth: Payer: Self-pay | Admitting: Certified Nurse Midwife

## 2015-05-13 MED ORDER — METRONIDAZOLE 500 MG PO TABS: 500.0000 mg | ORAL_TABLET | Freq: Two times a day (BID) | ORAL | Status: DC

## 2015-05-13 NOTE — Telephone Encounter (Signed)
She may have Flagyl 500 mg BID for 7 days.  Caution her with GI and ETOH side effects.

## 2015-05-13 NOTE — Telephone Encounter (Signed)
Call to patient. She states she continues to have bacterial vaginosis symptoms despite treatment with Hylafem x 3 nights and then repeated. She states that oral flagyl usually works for her and requests to have this for treatment instead of the Hylafem. Denies fevers or abdominal.  Advised will send message to covering provider and will return call. Patient agreeable.

## 2015-05-13 NOTE — Telephone Encounter (Signed)
Patient would like to speak with nurse regarding her medication. Would prefer to have the pill form of the medication.

## 2015-05-13 NOTE — Telephone Encounter (Signed)
Order placed. Call to patient and message from Kem Boroughs, [place] given. Patient verbalized understanding of precautions and potential side effects. Will call back if symptoms continue. Routing to provider for final review. Patient agreeable to disposition. Will close encounter.

## 2015-05-15 ENCOUNTER — Telehealth: Payer: Self-pay

## 2015-05-15 NOTE — Telephone Encounter (Signed)
-----   Message from Elroy Channel, [place] sent at 05/06/2015  1:21 PM EDT ----- Attempted to call pt. Unable to leave voicemail. Number not in service or unavailable.

## 2015-05-15 NOTE — Telephone Encounter (Signed)
See lab result

## 2015-05-23 NOTE — Telephone Encounter (Signed)
Letter sent to patient after unable to reach her.

## 2015-05-29 NOTE — Telephone Encounter (Signed)
Okay to close chart per Johny Shock, NP. See lab result

## 2015-05-31 ENCOUNTER — Telehealth: Payer: Self-pay | Admitting: Certified Nurse Midwife

## 2015-05-31 NOTE — Telephone Encounter (Signed)
Notes Recorded by Regina Eck, CNM on 05/29/2015 at 9:57 AM Ok to close chart Notes Recorded by Susy Manor, CMA on 05/29/2015 at 9:20 AM Letter sent to patient after several attempts to reach her. Pts GC/CHL came back normal. After no callback, okay to close encounter? Notes Recorded by Susy Manor, CMA on 05/14/2015 at 1:39 PM Unable to reach patient. Letter sent Notes Recorded by Susy Manor, CMA on 05/09/2015 at 3:10 PM Unable to reach patient. Try again. Notes Recorded by Susy Manor, CMA on 05/07/2015 at 10:31 AM Phone not accepting calls at this time. Try again. Notes Recorded by Elroy Channel, CMA on 05/06/2015 at 1:21 PM Attempted to call pt. Unable to leave voicemail. Number not in service or unavailable. Notes Recorded by Kem Boroughs, FNP on 05/06/2015 at 12:57 PM Please inform pt of negative GC and Chlamydia - she has already been given other test results. Notes Recorded by Susy Manor, CMA on 05/03/2015 at 2:21 PM Patient notified of results as written by provider Notes Recorded by Susy Manor, CMA on 05/03/2015 at 12:44 PM Left message for patient to callback Notes Recorded by Susy Manor, CMA on 05/03/2015 at 10:09 AM Left message for patient to callback Notes Recorded by Regina Eck, CNM on 05/03/2015 at 7:57 AM Notify patient that affirm positive for Bv, negative for yeast and trichomonas Order in for Hylafem give instructions HIV,RPR negative Hgb A1-C is normal 5.3 TSH is normal Lipid panel looks great! Gc,Chlamydia pending

## 2015-05-31 NOTE — Telephone Encounter (Signed)
Patient says she received a letter in the mail in regards to [place] trying to reach her for lab results. Best contact # [phone] (work)

## 2015-05-31 NOTE — Telephone Encounter (Signed)
Spoke with patient. Advised that GC and Chlamydia are negative. Patient is agreeable.  Routing to provider for final review. Patient agreeable to disposition. Will close encounter.

## 2015-05-31 NOTE — Telephone Encounter (Signed)
Attempted to reach patient at number provided [phone]. Recording states that the person I am trying to reach is not accepting calls at this time. Will try again later. Need to notify patient that her GC and Chlamydia are negative.

## 2015-06-08 ENCOUNTER — Encounter (HOSPITAL_COMMUNITY): Payer: Self-pay | Admitting: Emergency Medicine

## 2015-06-08 ENCOUNTER — Emergency Department (INDEPENDENT_AMBULATORY_CARE_PROVIDER_SITE_OTHER)
Admission: EM | Admit: 2015-06-08 | Discharge: 2015-06-08 | Disposition: A | Discharge status: Home / Self Care | Payer: BLUE CROSS/BLUE SHIELD | Source: Home / Self Care | Attending: Family Medicine | Admitting: Family Medicine

## 2015-06-08 DIAGNOSIS — S43004A Unspecified dislocation of right shoulder joint, initial encounter: Secondary | ICD-10-CM

## 2015-06-08 MED ORDER — HYDROMORPHONE HCL 1 MG/ML IJ SOLN
1.0000 mg | Freq: Once | INTRAMUSCULAR | Status: AC
  Administered 2015-06-08: 1 mg via INTRAMUSCULAR

## 2015-06-08 MED ORDER — HYDROMORPHONE HCL 1 MG/ML IJ SOLN
INTRAMUSCULAR | Status: AC
  Filled 2015-06-08: qty 1

## 2015-06-08 NOTE — ED Notes (Signed)
Pt here with right dislocated shoulder after swinging backwards 45 minutes ago States this is the 3rd time  Constant, throb pain reported

## 2015-06-08 NOTE — Discharge Instructions (Signed)
Shoulder Dislocation Ice . Wear shoulder sling for next 2-3 days.  Ibuprofen as needed A shoulder dislocation happens when the upper arm bone (humerus) moves out of the shoulder joint. The shoulder joint is the part of the shoulder where the humerus, shoulder blade (scapula), and collarbone (clavicle) meet. CAUSES This condition is often caused by:  A fall.  A hit to the shoulder.  A forceful movement of the shoulder. RISK FACTORS This condition is more likely to develop in people who play sports. SYMPTOMS Symptoms of this condition include:  Deformity of the shoulder.  Intense pain.  Inability to move the shoulder.  Numbness, weakness, or tingling in your neck or down your arm.  Bruising or swelling around your shoulder. DIAGNOSIS This condition is diagnosed with a physical exam. After the exam, tests may be done to check for related problems. Tests that may be done include:  X-ray. This may be done to check for broken bones.  MRI. This may be done to check for damage to the tissues around the shoulder.  Electromyogram. This may be done to check for nerve damage. TREATMENT This condition is treated with a procedure to place the humerus back in the joint. This procedure is called a reduction. There are two types of reduction:  Closed reduction. In this procedure, the humerus is placed back in the joint without surgery. The health care provider uses his or her hands to guide the bone back into place.  Open reduction. In this procedure, the humerus is placed back in the joint with surgery. An open reduction may be recommended if:  You have a weak shoulder joint or weak ligaments.  You have had more than one shoulder dislocation.  The nerves or blood vessels around your shoulder have been damaged. After the humerus is placed back into the joint, your arm will be placed in a splint or sling to prevent it from moving. You will need to wear the splint or sling until your  shoulder heals. When the splint or sling is removed, you may have physical therapy to help improve the range of motion in your shoulder joint. HOME CARE INSTRUCTIONS If You Have a Splint or Sling:  Wear it as told by your health care provider. Remove it only as told by your health care provider.  Loosen it if your fingers become numb and tingle, or if they turn cold and blue.  Keep it clean and dry. Bathing  Do not take baths, swim, or use a hot tub until your health care provider approves. Ask your health care provider if you can take showers. You may only be allowed to take sponge baths for bathing.  If your health care provider approves bathing and showering, cover your splint or sling with a watertight plastic bag to protect it from water. Do not let the splint or sling get wet. Managing Pain, Stiffness, and Swelling  If directed, apply ice to the injured area.  Put ice in a plastic bag.  Place a towel between your skin and the bag.  Leave the ice on for 20 minutes, 2-3 times per day.  Move your fingers often to avoid stiffness and to decrease swelling.  Raise (elevate) the injured area above the level of your heart while you are sitting or lying down. Driving  Do not drive while wearing a splint or sling on a hand that you use for driving.  Do not drive or operate heavy machinery while taking pain medicine. Activity  Return  to your normal activities as told by your health care provider. Ask your health care provider what activities are safe for you.  Perform range-of-motion exercises only as told by your health care provider.  Exercise your hand by squeezing a soft ball. This helps to decrease stiffness and swelling in your hand and wrist. General Instructions  Take over-the-counter and prescription medicines only as told by your health care provider.  Do not use any tobacco products, including cigarettes, chewing tobacco, or e-cigarettes. Tobacco can delay bone and  tissue healing. If you need help quitting, ask your health care provider.  Keep all follow-up visits as told by your health care provider. This is important. SEEK MEDICAL CARE IF:  Your splint or sling gets damaged. SEEK IMMEDIATE MEDICAL CARE IF:  Your pain gets worse rather than better.  You lose feeling in your arm or hand.  Your arm or hand becomes white and cold.   This information is not intended to replace advice given to you by your health care provider. Make sure you discuss any questions you have with your health care provider.   Document Released: 04/21/2001 Document Revised: 04/17/2015 Document Reviewed: 11/19/2014 Elsevier Interactive Patient Education [employer].

## 2015-06-08 NOTE — ED Provider Notes (Signed)
CSN: [id number]     Arrival date & time 06/08/15  1302 History   First MD Initiated Contact with Patient 06/08/15 1312     Chief Complaint  Patient presents with  . Shoulder Injury    Dislocation    (Consider location/radiation/quality/duration/timing/severity/associated sxs/prior Treatment) HPI Comments: 34 year old female who was in a car and reached back with her right arm and in the process dislocated her right shoulder. This is her sixth time this is occurred. She presents with right shoulder pain along with right shoulder asymmetry.   Past Medical History  Diagnosis Date  . No pertinent past medical history   . Anemia   . Dyspareunia    Past Surgical History  Procedure Laterality Date  . Tubal ligation  6 years ago    Family History  Problem Relation Age of Onset  . Diabetes Mellitus I Daughter    Social History  Substance Use Topics  . Smoking status: Never Smoker   . Smokeless tobacco: Never Used  . Alcohol Use: 2.4 oz/week    4 Standard drinks or equivalent per week   OB History    Gravida Para Term Preterm AB TAB SAB Ectopic Multiple Living   5 5 5       5      Review of Systems  Constitutional: Positive for activity change. Negative for fever and fatigue.  HENT: Negative.   Respiratory: Negative.   Musculoskeletal: Negative for myalgias and joint swelling.       As per history of present illness  Skin: Negative.   Neurological: Negative.     Allergies  Review of patient's allergies indicates no known allergies.  Home Medications   Prior to Admission medications   Medication Sig Start Date End Date Taking? Authorizing Provider  metroNIDAZOLE (FLAGYL) 500 MG tablet Take 1 tablet (500 mg total) by mouth 2 (two) times daily. 05/13/15  Yes Kem Boroughs, FNP  Homeopathic Products (HYLAFEM) SUPP Place 1 suppository vaginally at bedtime. For 3 nights. Wait 1 week and repeat 3 nights if symptoms persisit 05/03/15   Regina Eck, CNM  IRON PO Take by  mouth as needed.    Historical Provider, MD  omeprazole (PRILOSEC) 40 MG capsule Take 1 capsule (40 mg total) by mouth daily. 09/17/14   Liam Graham, PA-C   Meds Ordered and Administered this Visit   Medications  HYDROmorphone (DILAUDID) injection 1 mg (1 mg Intramuscular Given 06/08/15 1333)    BP 118/84 mmHg  Pulse 72  Temp(Src) 97.5 F (36.4 C) (Oral)  Resp 18  SpO2 100% No data found.   Physical Exam  Constitutional: She is oriented to person, place, and time. She appears well-developed and well-nourished. No distress.  HENT:  Head: Normocephalic and atraumatic.  Eyes: EOM are normal. Pupils are equal, round, and reactive to light.  Neck: Normal range of motion. Neck supple.  Pulmonary/Chest: Effort normal. No respiratory distress.  Musculoskeletal:  Right shoulder asymmetry. Moderate tenderness. No swelling. Distal neurovascular motor Sentry is intact. Radial pulses 2+. No local or distal edema.  Neurological: She is alert and oriented to person, place, and time. No cranial nerve deficit.  Skin: Skin is warm and dry.  Nursing note and vitals reviewed.   ED Course  Reduction of dislocation Date/Time: 06/08/2015 1:56 PM Performed by: Marcha Dutton, Tiare Rohlman Authorized by: Ihor Gully D Consent: Verbal consent obtained. Risks and benefits: risks, benefits and alternatives were discussed Consent given by: patient Patient understanding: patient states understanding of the procedure being  performed Patient identity confirmed: verbally with patient Local anesthesia used: no Patient sedated: yes Analgesia: hydromorphone Sedation start date/time: 06/08/2015 1:57 PM Sedation end date/time: 06/08/2015 1:58 PM Comments: Reduction successful, shoulder movement and ROM nl. Distal pulse, M/S intact.   (including critical care time)  Labs Review Labs Reviewed - No data to display  Imaging Review No results found.   Visual Acuity Review  Right Eye Distance:   Left Eye  Distance:   Bilateral Distance:    Right Eye Near:   Left Eye Near:    Bilateral Near:         MDM   1. Dislocated shoulder, right, initial encounter    After the patient received a lot of 1 mg IM traction was applied to the right arm forward and the shoulder relocated. She was able to move it around in a normal fashion after relocation. She is to wear sling for 2-3 days and place ice on the shoulder. She is instructed to follow-up with a orthopedist as soon as possible. For the next several hours no driving or operating machinery. Rest.    Janne Napoleon, NP 06/08/15 1359

## 2015-06-25 ENCOUNTER — Telehealth: Payer: Self-pay | Admitting: Certified Nurse Midwife

## 2015-06-25 NOTE — Telephone Encounter (Signed)
Patient called wanting to know if she can get a refill of flagyl, she is wanting to take a pill everyday if not she get a bacterial infection. Best # to reach: [phone]

## 2015-06-25 NOTE — Telephone Encounter (Signed)
Spoke with patient. Patient was last seen and treated for BV on 05/02/2015. Advised she will need to be seen in the office for further evaluation and treatment. Patient is agreeable. Patient would like to discuss suppression treatment for BV as she feels this is continually returning. Appointment scheduled for tomorrow 11/16 at 4 pm with Melvia Heaps CNM. Agreeable to date and time.  Routing to provider for final review. Patient agreeable to disposition. Will close encounter.

## 2015-06-26 ENCOUNTER — Encounter: Payer: Self-pay | Admitting: Certified Nurse Midwife

## 2015-06-26 ENCOUNTER — Ambulatory Visit (INDEPENDENT_AMBULATORY_CARE_PROVIDER_SITE_OTHER): Payer: BLUE CROSS/BLUE SHIELD | Admitting: Certified Nurse Midwife

## 2015-06-26 VITALS — BP 110/78 | HR 70 | Resp 16 | Ht 66.0 in | Wt 165.0 lb

## 2015-06-26 DIAGNOSIS — Z113 Encounter for screening for infections with a predominantly sexual mode of transmission: Secondary | ICD-10-CM | POA: Diagnosis not present

## 2015-06-26 DIAGNOSIS — N898 Other specified noninflammatory disorders of vagina: Secondary | ICD-10-CM

## 2015-06-26 DIAGNOSIS — N9489 Other specified conditions associated with female genital organs and menstrual cycle: Secondary | ICD-10-CM | POA: Diagnosis not present

## 2015-06-26 DIAGNOSIS — L731 Pseudofolliculitis barbae: Secondary | ICD-10-CM

## 2015-06-26 NOTE — Patient Instructions (Signed)
Ingrown Hair An ingrown hair is a hair that curls and re-enters the skin instead of growing straight out of the skin. It happens most often with curly hair. It is usually more severe in the neck area, but it can occur in any shaved area, including the beard area, groin, scalp, and legs. An ingrown hair may cause small pockets of infection. CAUSES  Shaving closely, tweezing, or waxing, especially curly hair. Using hair removal creams can sometimes lead to ingrown hairs, especially in the groin. SYMPTOMS   Small bumps on the skin. The bumps may be filled with pus.  Pain.  Itching. DIAGNOSIS  Your caregiver can usually tell what is wrong by doing a physical exam. TREATMENT  If there is a severe infection, your caregiver may prescribe antibiotic medicines. Laser hair removal may also be done to help prevent regrowth of the hair. HOME CARE INSTRUCTIONS   Do not shave irritated skin. You may start shaving again once the irritation has gone away.  If you are prone to ingrown hairs, consider not shaving as much as possible.  If antibiotics are prescribed, take them as directed. Finish them even if you start to feel better.  You may use a facial sponge in a gentle circular motion to help dislodge ingrown hairs on the face.  You may use a hair removal cream weekly, especially on the legs and underarms. Stop using the cream if it irritates your skin. Use caution when using hair removal creams in the groin area. SHAVING INSTRUCTIONS AFTER TREATMENT  Shower before shaving. Keep areas to be shaved packed in warm, moist wraps for several minutes before shaving. The warm, moist environment helps soften the hairs and makes ingrown hairs less likely to occur.  Use thick shaving gels.  Use a bump fighter razor that cuts hair slightly above the skin level or use an electric shaver with a longer shave setting.  Shave in the direction of hair growth. Avoid making multiple razor strokes.  Use  moisturizing lotions after shaving.   This information is not intended to replace advice given to you by your health care provider. Make sure you discuss any questions you have with your health care provider.   Document Released: 11/02/2000 Document Revised: 01/26/2012 Document Reviewed: 10/27/2011 Elsevier Interactive Patient Education 2016 Elsevier Inc.   Bacitracin ointment to ingrown hair area twice daily and epsom salt soaks daily

## 2015-06-26 NOTE — Progress Notes (Signed)
34 y.o.Single  [id number] here with complaint of vaginal symptoms of  burning, odor and increase discharge. Describes discharge as odorous. New partner, desires GC, Chlamydia screening also. Patient feels she has BV every time she changes partners.. Onset of symptoms 10 days ago. Denies new personal products or vaginal dryness. Patient also has ? Wart on inner left thigh, please check.  Urinary symptoms none . Contraception is BTL. No other health issues today.   O:Healthy female WDWN Affect: normal, orientation x 3  Exam: Abdomen:soft, non tender, no lesions Lymph node: no enlargement or tenderness Pelvic exam: External genital: normal female, no lesions, area on left inner thigh near crease, ingrown hair noted with slight edema, no redness, slight tenderness only, no exudate BUS: negative Vagina: watery grey discharge noted.  Affirm taken Cervix: normal, non tender, no CMT Uterus: normal, non tender Adnexa:normal, non tender, no masses or fullness noted  A:Normal pelvic exam STD screening R/O vaginal infection history of BV Ingrown hair   P:Discussed findings of vaginal discharge ? BV and etiology. Will wait on affirm results. If positive would recommend trial of Hylafem if good results, trial of Boric acid when notices odor for treatment. Patient agreeable. Discussed Aveeno or baking soda sitz bath for comfort.  Lab: GC,Chlamydia, Affirm Discussed ingrown hair finding and etiology. Discussed epsom salt soak to area and massage to help with resolution. OTC Bacitracin bid topical x 5 days. Warning signs discussed and need to have evaluated if occurs.  Rv prn

## 2015-06-27 ENCOUNTER — Other Ambulatory Visit: Payer: Self-pay | Admitting: Certified Nurse Midwife

## 2015-06-27 ENCOUNTER — Telehealth: Payer: Self-pay

## 2015-06-27 DIAGNOSIS — B9689 Other specified bacterial agents as the cause of diseases classified elsewhere: Secondary | ICD-10-CM

## 2015-06-27 DIAGNOSIS — N760 Acute vaginitis: Principal | ICD-10-CM

## 2015-06-27 LAB — WET PREP BY MOLECULAR PROBE
Candida species: NEGATIVE
Gardnerella vaginalis: POSITIVE — AB
Trichomonas vaginosis: NEGATIVE

## 2015-06-27 MED ORDER — HYLAFEM VA SUPP: 1.0000 | Freq: Every day | VAGINAL | Status: DC

## 2015-06-27 NOTE — Telephone Encounter (Signed)
Returned call

## 2015-06-27 NOTE — Telephone Encounter (Signed)
Patient notified of results. See lab 

## 2015-06-27 NOTE — Telephone Encounter (Signed)
Patient returning your call Best # to reach: [phone]

## 2015-06-27 NOTE — Telephone Encounter (Signed)
lmtcb

## 2015-06-27 NOTE — Telephone Encounter (Signed)
-----   Message from Regina Eck, CNM sent at 06/27/2015  8:23 AM EST ----- Notify patient that her affirm is positive for BV, negative for yeast and trichomonas. Rx Hylafem as we discussed, order in   She needs to call when finished with treatment for instructions for chronic management Gc, chlamydia pending

## 2015-06-27 NOTE — Telephone Encounter (Signed)
Patient is returning a call to [place]. Please call patient at work @ [phone]

## 2015-06-30 NOTE — Progress Notes (Signed)
Reviewed personally.  M. Suzanne Benjimin Hadden, MD.  

## 2015-07-01 LAB — IPS N GONORRHOEA AND CHLAMYDIA BY PCR

## 2015-09-17 ENCOUNTER — Encounter: Payer: Self-pay | Admitting: Certified Nurse Midwife

## 2015-09-17 ENCOUNTER — Ambulatory Visit (INDEPENDENT_AMBULATORY_CARE_PROVIDER_SITE_OTHER): Payer: BLUE CROSS/BLUE SHIELD | Admitting: Certified Nurse Midwife

## 2015-09-17 VITALS — BP 102/78 | HR 70 | Resp 16 | Ht 66.0 in | Wt 168.0 lb

## 2015-09-17 DIAGNOSIS — B373 Candidiasis of vulva and vagina: Secondary | ICD-10-CM | POA: Diagnosis not present

## 2015-09-17 DIAGNOSIS — A499 Bacterial infection, unspecified: Secondary | ICD-10-CM | POA: Diagnosis not present

## 2015-09-17 DIAGNOSIS — N760 Acute vaginitis: Secondary | ICD-10-CM | POA: Diagnosis not present

## 2015-09-17 DIAGNOSIS — B3731 Acute candidiasis of vulva and vagina: Secondary | ICD-10-CM

## 2015-09-17 DIAGNOSIS — B9689 Other specified bacterial agents as the cause of diseases classified elsewhere: Secondary | ICD-10-CM

## 2015-09-17 MED ORDER — HYLAFEM VA SUPP: 1.0000 | Freq: Every day | VAGINAL | Status: DC

## 2015-09-17 NOTE — Progress Notes (Signed)
Reviewed personally.  M. Suzanne Ashiyah Pavlak, MD.  

## 2015-09-17 NOTE — Patient Instructions (Signed)

## 2015-09-17 NOTE — Progress Notes (Signed)
35 y.o. Single Caucasian female [id number] here with complaint of vaginal symptoms of  and increase discharge. Describes discharge as white,non odorous.. Onset of symptoms 14 days ago. Denies new personal products. No STD concerns. Urinary symptoms none . Contraception is BTL. Patient has used Rx Hylafem before and had good results. Has used the Hylafem once after sexual activity and she felt prevented any infection symptoms also. No other health issues.   O:Healthy female WDWN Affect: normal, orientation x 3  Exam: Abdomen:soft, non tender Lymph node: no enlargement or tenderness Pelvic exam: External genital: normal female BUS: negative Vagina: white thin watery discharge noted. Ph:4.5   ,Wet prep taken,  Cervix: normal, non tender, no CMT Uterus: normal, non tender Adnexa:normal, non tender, no masses or fullness noted   Wet Prep results: positive for clue cells, 1-2 yeast buds noted   A:Normal pelvic exam BV Yeast vaginitis   P:Discussed findings of BV and yeast finding and etiology. Discussed Aveeno or baking soda sitz bath for comfort. Avoid moist clothes or pads for extended period of time. If working out in gym clothes change underwear. Coconut Oil use for skin protection prior to activity can be used to external skin for protection or dryness. Questions addressed.  Rx: Hylafem with instructions  Rv prn

## 2015-11-29 ENCOUNTER — Emergency Department (HOSPITAL_COMMUNITY)
Admission: EM | Admit: 2015-11-29 | Discharge: 2015-11-29 | Disposition: A | Discharge status: Home / Self Care | Payer: BLUE CROSS/BLUE SHIELD | Attending: Emergency Medicine | Admitting: Emergency Medicine

## 2015-11-29 ENCOUNTER — Encounter (HOSPITAL_COMMUNITY): Payer: Self-pay | Admitting: *Deleted

## 2015-11-29 ENCOUNTER — Emergency Department (HOSPITAL_COMMUNITY): Payer: BLUE CROSS/BLUE SHIELD

## 2015-11-29 DIAGNOSIS — Z3202 Encounter for pregnancy test, result negative: Secondary | ICD-10-CM | POA: Insufficient documentation

## 2015-11-29 DIAGNOSIS — R0789 Other chest pain: Secondary | ICD-10-CM | POA: Insufficient documentation

## 2015-11-29 DIAGNOSIS — Z862 Personal history of diseases of the blood and blood-forming organs and certain disorders involving the immune mechanism: Secondary | ICD-10-CM | POA: Insufficient documentation

## 2015-11-29 DIAGNOSIS — M546 Pain in thoracic spine: Secondary | ICD-10-CM | POA: Diagnosis not present

## 2015-11-29 DIAGNOSIS — M542 Cervicalgia: Secondary | ICD-10-CM | POA: Insufficient documentation

## 2015-11-29 DIAGNOSIS — K2970 Gastritis, unspecified, without bleeding: Secondary | ICD-10-CM

## 2015-11-29 DIAGNOSIS — R079 Chest pain, unspecified: Secondary | ICD-10-CM | POA: Diagnosis present

## 2015-11-29 DIAGNOSIS — Z8742 Personal history of other diseases of the female genital tract: Secondary | ICD-10-CM | POA: Diagnosis not present

## 2015-11-29 LAB — I-STAT TROPONIN, ED: Troponin i, poc: 0 ng/mL (ref 0.00–0.08)

## 2015-11-29 LAB — BASIC METABOLIC PANEL
Anion gap: 9 (ref 5–15)
BUN: 11 mg/dL (ref 6–20)
CO2: 24 mmol/L (ref 22–32)
Calcium: 8.9 mg/dL (ref 8.9–10.3)
Chloride: 105 mmol/L (ref 101–111)
Creatinine, Ser: 0.82 mg/dL (ref 0.44–1.00)
GFR calc Af Amer: >60 mL/min (ref >60)
GFR calc non Af Amer: >60 mL/min (ref >60)
Glucose, Bld: 87 mg/dL (ref 65–99)
Potassium: 3.9 mmol/L (ref 3.5–5.1)
Sodium: 138 mmol/L (ref 135–145)

## 2015-11-29 LAB — CBC
HCT: 36.3 % (ref 36.0–46.0)
Hemoglobin: 11.6 g/dL — ABNORMAL LOW (ref 12.0–15.0)
MCH: 28.6 pg (ref 26.0–34.0)
MCHC: 32 g/dL (ref 30.0–36.0)
MCV: 89.4 fL (ref 78.0–100.0)
Platelets: 262 10*3/uL (ref 150–400)
RBC: 4.06 MIL/uL (ref 3.87–5.11)
RDW: 13.2 % (ref 11.5–15.5)
WBC: 7.9 10*3/uL (ref 4.0–10.5)

## 2015-11-29 LAB — HEPATIC FUNCTION PANEL
ALT: 10 U/L — ABNORMAL LOW (ref 14–54)
AST: 16 U/L (ref 15–41)
Albumin: 3.6 g/dL (ref 3.5–5.0)
Alkaline Phosphatase: 49 U/L (ref 38–126)
Bilirubin, Direct: 0.1 mg/dL (ref 0.1–0.5)
Indirect Bilirubin: 0.8 mg/dL (ref 0.3–0.9)
Total Bilirubin: 0.9 mg/dL (ref 0.3–1.2)
Total Protein: 6.7 g/dL (ref 6.5–8.1)

## 2015-11-29 LAB — TROPONIN I: Troponin I: <0.03 ng/mL (ref <0.031)

## 2015-11-29 LAB — LIPASE, BLOOD: Lipase: 29 U/L (ref 11–51)

## 2015-11-29 LAB — D-DIMER, QUANTITATIVE: D-Dimer, Quant: <0.27 ug/mL-FEU (ref 0.00–0.50)

## 2015-11-29 LAB — POC URINE PREG, ED: Preg Test, Ur: NEGATIVE

## 2015-11-29 MED ORDER — OXYCODONE-ACETAMINOPHEN 5-325 MG PO TABS
ORAL_TABLET | ORAL | Status: AC
  Filled 2015-11-29: qty 1

## 2015-11-29 MED ORDER — TRAMADOL HCL 50 MG PO TABS: 50.0000 mg | ORAL_TABLET | Freq: Four times a day (QID) | ORAL | Status: DC | PRN

## 2015-11-29 MED ORDER — GI COCKTAIL ~~LOC~~
30.0000 mL | Freq: Once | ORAL | Status: AC
  Administered 2015-11-29: 30 mL via ORAL
  Filled 2015-11-29: qty 30

## 2015-11-29 MED ORDER — METHOCARBAMOL 500 MG PO TABS
1000.0000 mg | ORAL_TABLET | Freq: Once | ORAL | Status: AC
  Administered 2015-11-29: 1000 mg via ORAL
  Filled 2015-11-29: qty 2

## 2015-11-29 MED ORDER — KETOROLAC TROMETHAMINE 30 MG/ML IJ SOLN
30.0000 mg | Freq: Once | INTRAMUSCULAR | Status: AC
  Administered 2015-11-29: 30 mg via INTRAVENOUS
  Filled 2015-11-29: qty 1

## 2015-11-29 MED ORDER — OXYCODONE-ACETAMINOPHEN 5-325 MG PO TABS
1.0000 | ORAL_TABLET | ORAL | Status: DC | PRN
  Administered 2015-11-29: 1 via ORAL

## 2015-11-29 MED ORDER — LANSOPRAZOLE 30 MG PO CPDR: 30.0000 mg | DELAYED_RELEASE_CAPSULE | Freq: Every day | ORAL | Status: DC

## 2015-11-29 MED ORDER — METHOCARBAMOL 500 MG PO TABS: 1000.0000 mg | ORAL_TABLET | Freq: Three times a day (TID) | ORAL | Status: DC | PRN

## 2015-11-29 NOTE — ED Provider Notes (Signed)
CSN: [id number]     Arrival date & time 11/29/15  1453 History   First MD Initiated Contact with Patient 11/29/15 1846     Chief Complaint  Patient presents with  . Chest Pain     (Consider location/radiation/quality/duration/timing/severity/associated sxs/prior Treatment) HPI Patient presents with left sided thoracic back and chest pain. Started this afternoon while walking in the store. Worse with movement and deep breathing. Denies cough or recent trauma. No fever or chills. No new lower extremity swelling or pain. Patient with no personal or family history of probable embolic disease. No recent extended travel or oral birth control pills. No risk factors for coronary artery disease. Patient readily takes ibuprofen. She has developed also upper abdominal discomfort. No nausea or vomiting. No blood in stool. Past Medical History  Diagnosis Date  . No pertinent past medical history   . Anemia   . Dyspareunia    Past Surgical History  Procedure Laterality Date  . Tubal ligation  6 years ago    Family History  Problem Relation Age of Onset  . Diabetes Mellitus I Daughter    Social History  Substance Use Topics  . Smoking status: Never Smoker   . Smokeless tobacco: Never Used  . Alcohol Use: 2.4 oz/week    4 Standard drinks or equivalent per week   OB History    Gravida Para Term Preterm AB TAB SAB Ectopic Multiple Living   5 5 5       5      Review of Systems  Constitutional: Negative for fever and chills.  Respiratory: Negative for cough and shortness of breath.   Cardiovascular: Positive for chest pain. Negative for palpitations and leg swelling.  Gastrointestinal: Positive for abdominal pain. Negative for nausea, vomiting, diarrhea, constipation and blood in stool.  Musculoskeletal: Positive for myalgias, back pain and neck pain. Negative for neck stiffness.  Skin: Negative for rash and wound.  Neurological: Negative for dizziness, syncope, weakness, light-headedness,  numbness and headaches.  All other systems reviewed and are negative.     Allergies  Review of patient's allergies indicates no known allergies.  Home Medications   Prior to Admission medications   Medication Sig Start Date End Date Taking? Authorizing Provider  Homeopathic Products (HYLAFEM) SUPP Place 1 suppository vaginally at bedtime. 09/17/15   Regina Eck, CNM  lansoprazole (PREVACID) 30 MG capsule Take 1 capsule (30 mg total) by mouth daily at 12 noon. 11/29/15   Julianne Rice, MD  methocarbamol (ROBAXIN) 500 MG tablet Take 2 tablets (1,000 mg total) by mouth every 8 (eight) hours as needed. 11/29/15   Julianne Rice, MD  traMADol (ULTRAM) 50 MG tablet Take 1 tablet (50 mg total) by mouth every 6 (six) hours as needed for moderate pain or severe pain. 11/29/15   Julianne Rice, MD   BP 110/84 mmHg  Pulse 64  Temp(Src) 97.6 F (36.4 C) (Oral)  Resp 17  Ht 5\' 6"  (1.676 m)  Wt 167 lb (75.751 kg)  BMI 26.97 kg/m2  SpO2 100%  LMP 11/26/2015 Physical Exam  Constitutional: She is oriented to person, place, and time. She appears well-developed and well-nourished. No distress.  HENT:  Head: Normocephalic and atraumatic.  Mouth/Throat: Oropharynx is clear and moist. No oropharyngeal exudate.  Eyes: EOM are normal. Pupils are equal, round, and reactive to light.  Neck: Normal range of motion. Neck supple.  Cardiovascular: Normal rate and regular rhythm.   Pulmonary/Chest: Effort normal and breath sounds normal. No respiratory distress. She  has no wheezes. She has no rales. She exhibits tenderness (Completely reproduced left upper chest wall tenderness. No crepitance or deformity).  Abdominal: Soft. Bowel sounds are normal. She exhibits no distension and no mass. There is tenderness (epigastric tenderness with palpation.). There is no rebound and no guarding.  Musculoskeletal: Normal range of motion. She exhibits no edema or tenderness.  No midline thoracic or lumbar  tenderness. No CVA tenderness. No lower extremity swelling, asymmetry or tenderness. Distal pulses are all equal and intact.  Neurological: She is alert and oriented to person, place, and time.  Patient is alert and oriented x3 with clear, goal oriented speech. Patient has 5/5 motor in all extremities. Sensation is intact to light touch. Bilateral finger-to-nose is normal with no signs of dysmetria. Patient has a normal gait and walks without assistance.  Skin: Skin is warm and dry. No rash noted. No erythema.  Psychiatric: She has a normal mood and affect. Her behavior is normal.  Nursing note and vitals reviewed.   ED Course  Procedures (including critical care time) Labs Review Labs Reviewed  CBC - Abnormal; Notable for the following:    Hemoglobin 11.6 (*)    All other components within normal limits  HEPATIC FUNCTION PANEL - Abnormal; Notable for the following:    ALT 10 (*)    All other components within normal limits  BASIC METABOLIC PANEL  TROPONIN I  D-DIMER, QUANTITATIVE (NOT AT [facility])  LIPASE, BLOOD  I-STAT TROPOININ, ED  I-STAT BETA HCG BLOOD, ED ([facility], [facility], [facility] ONLY)  POC URINE PREG, ED  Randolm Idol, ED    Imaging Review Dg Chest 2 View  11/29/2015  CLINICAL DATA:  35 year old female with a history of chest pain on the left. EXAM: CHEST - 2 VIEW COMPARISON:  01/05/2009 FINDINGS: Cardiomediastinal silhouette projects within normal limits in size and contour. No confluent airspace disease, pneumothorax, or pleural effusion. No displaced fracture. Unremarkable appearance of the upper abdomen. IMPRESSION: No radiographic evidence of acute cardiopulmonary disease. Signed, Dulcy Fanny. Earleen Newport, DO [facility] Radiology Electronically Signed   By: Corrie Mckusick D.O.   On: 11/29/2015 15:32   I have personally reviewed and evaluated these images and lab results as part of my medical decision-making.   EKG Interpretation   Date/Time:   Friday November 29 2015 14:57:17 EDT Ventricular Rate:  77 PR Interval:  142 QRS Duration: 76 QT Interval:  374 QTC Calculation: 423 R Axis:   78 Text Interpretation:  Normal sinus rhythm with sinus arrhythmia Possible  Left atrial enlargement Borderline ECG Confirmed by Nikolai Wilczak  MD, Yailene Badia  ([id number]) on 11/29/2015 6:52:38 PM      MDM   Final diagnoses:  Left-sided chest wall pain  Gastritis    D-dimer, troponin 2 are normal. EKG without evidence of ischemia. Chest x-ray without abnormality. Patient states that her upper abdominal pain is improved after GI cocktail. Chest wall pain is improved but still present. Worse with movement. Patient has no risk factors for coronary artery disease. We'll treat symptomatically. Have advised to avoid NSAIDs. We'll also start on PPI. Return precautions given.    Julianne Rice, MD 11/29/15 2242

## 2015-11-29 NOTE — Discharge Instructions (Signed)
Taking ibuprofen and all related NSAID medication. You may take over-the-counter Tylenol and medications prescribed.  Chest Wall Pain Chest wall pain is pain in or around the bones and muscles of your chest. Sometimes, an injury causes this pain. Sometimes, the cause may not be known. This pain may take several weeks or longer to get better. HOME CARE INSTRUCTIONS  Pay attention to any changes in your symptoms. Take these actions to help with your pain:   Rest as told by your health care provider.   Avoid activities that cause pain. These include any activities that use your chest muscles or your abdominal and side muscles to lift heavy items.   If directed, apply ice to the painful area:  Put ice in a plastic bag.  Place a towel between your skin and the bag.  Leave the ice on for 20 minutes, 2-3 times per day.  Take over-the-counter and prescription medicines only as told by your health care provider.  Do not use tobacco products, including cigarettes, chewing tobacco, and e-cigarettes. If you need help quitting, ask your health care provider.  Keep all follow-up visits as told by your health care provider. This is important. SEEK MEDICAL CARE IF:  You have a fever.  Your chest pain becomes worse.  You have new symptoms. SEEK IMMEDIATE MEDICAL CARE IF:  You have nausea or vomiting.  You feel sweaty or light-headed.  You have a cough with phlegm (sputum) or you cough up blood.  You develop shortness of breath.   This information is not intended to replace advice given to you by your health care provider. Make sure you discuss any questions you have with your health care provider.   Document Released: 07/27/2005 Document Revised: 04/17/2015 Document Reviewed: 10/22/2014 Elsevier Interactive Patient Education 2016 Elsevier Inc.  Gastritis, Adult Gastritis is soreness and swelling (inflammation) of the lining of the stomach. Gastritis can develop as a sudden onset  (acute) or long-term (chronic) condition. If gastritis is not treated, it can lead to stomach bleeding and ulcers. CAUSES  Gastritis occurs when the stomach lining is weak or damaged. Digestive juices from the stomach then inflame the weakened stomach lining. The stomach lining may be weak or damaged due to viral or bacterial infections. One common bacterial infection is the Helicobacter pylori infection. Gastritis can also result from excessive alcohol consumption, taking certain medicines, or having too much acid in the stomach.  SYMPTOMS  In some cases, there are no symptoms. When symptoms are present, they may include:  Pain or a burning sensation in the upper abdomen.  Nausea.  Vomiting.  An uncomfortable feeling of fullness after eating. DIAGNOSIS  Your caregiver may suspect you have gastritis based on your symptoms and a physical exam. To determine the cause of your gastritis, your caregiver may perform the following:  Blood or stool tests to check for the H pylori bacterium.  Gastroscopy. A thin, flexible tube (endoscope) is passed down the esophagus and into the stomach. The endoscope has a light and camera on the end. Your caregiver uses the endoscope to view the inside of the stomach.  Taking a tissue sample (biopsy) from the stomach to examine under a microscope. TREATMENT  Depending on the cause of your gastritis, medicines may be prescribed. If you have a bacterial infection, such as an H pylori infection, antibiotics may be given. If your gastritis is caused by too much acid in the stomach, H2 blockers or antacids may be given. Your caregiver may recommend  that you stop taking aspirin, ibuprofen, or other nonsteroidal anti-inflammatory drugs (NSAIDs). HOME CARE INSTRUCTIONS  Only take over-the-counter or prescription medicines as directed by your caregiver.  If you were given antibiotic medicines, take them as directed. Finish them even if you start to feel better.  Drink  enough fluids to keep your urine clear or pale yellow.  Avoid foods and drinks that make your symptoms worse, such as:  Caffeine or alcoholic drinks.  Chocolate.  Peppermint or mint flavorings.  Garlic and onions.  Spicy foods.  Citrus fruits, such as oranges, lemons, or limes.  Tomato-based foods such as sauce, chili, salsa, and pizza.  Fried and fatty foods.  Eat small, frequent meals instead of large meals. SEEK IMMEDIATE MEDICAL CARE IF:   You have black or dark red stools.  You vomit blood or material that looks like coffee grounds.  You are unable to keep fluids down.  Your abdominal pain gets worse.  You have a fever.  You do not feel better after 1 week.  You have any other questions or concerns. MAKE SURE YOU:  Understand these instructions.  Will watch your condition.  Will get help right away if you are not doing well or get worse.   This information is not intended to replace advice given to you by your health care provider. Make sure you discuss any questions you have with your health care provider.   Document Released: 07/21/2001 Document Revised: 01/26/2012 Document Reviewed: 09/09/2011 Elsevier Interactive Patient Education [employer].

## 2015-11-29 NOTE — ED Notes (Signed)
PT states L scapular pain while walking in store.  Pain is now L chest and shoulder pain as well.  Pain increases with inspiration.  Denies any recent travel.

## 2015-11-29 NOTE — ED Notes (Signed)
ASKING FOR WAIT TIME

## 2015-11-29 NOTE — ED Notes (Signed)
Pt stable, ambulatory, states understanding of discharge instructions 

## 2016-05-06 ENCOUNTER — Encounter: Payer: Self-pay | Admitting: Certified Nurse Midwife

## 2016-05-06 ENCOUNTER — Ambulatory Visit: Payer: BLUE CROSS/BLUE SHIELD | Admitting: Certified Nurse Midwife

## 2016-05-06 NOTE — Progress Notes (Deleted)
35 y.o. [id number] Single  African American Fe here for annual exam.    No LMP recorded.          Sexually active: {yes no:314532}  The current method of family planning is {contraception:315051}.    Exercising: {yes no:314532}  {types:19826} Smoker:  {YES NO:22349}  Health Maintenance: Pap:  03-21-14 neg HPV HR neg MMG:  none Colonoscopy:  none BMD:   none TDaP:  2014 Shingles: no Pneumonia: no Hep C and HIV: 2016 neg Labs:  Self breast exam:   reports that she has never smoked. She has never used smokeless tobacco. She reports that she drinks about 2.4 oz of alcohol per week . She reports that she does not use drugs.  Past Medical History:  Diagnosis Date  . Anemia   . Dyspareunia   . No pertinent past medical history     Past Surgical History:  Procedure Laterality Date  . TUBAL LIGATION  6 years ago     Current Outpatient Prescriptions  Medication Sig Dispense Refill  . Homeopathic Products (HYLAFEM) SUPP Place 1 suppository vaginally at bedtime. 3 suppository 1  . lansoprazole (PREVACID) 30 MG capsule Take 1 capsule (30 mg total) by mouth daily at 12 noon. 30 capsule 0  . methocarbamol (ROBAXIN) 500 MG tablet Take 2 tablets (1,000 mg total) by mouth every 8 (eight) hours as needed. 30 tablet 0  . traMADol (ULTRAM) 50 MG tablet Take 1 tablet (50 mg total) by mouth every 6 (six) hours as needed for moderate pain or severe pain. 15 tablet 0   No current facility-administered medications for this visit.     Family History  Problem Relation Age of Onset  . Diabetes Mellitus I Daughter     ROS:  Pertinent items are noted in HPI.  Otherwise, a comprehensive ROS was negative.  Exam:   There were no vitals taken for this visit.   Ht Readings from Last 3 Encounters:  11/29/15 5\' 6"  (1.676 m)  09/17/15 5\' 6"  (1.676 m)  06/26/15 5\' 6"  (1.676 m)    General appearance: alert, cooperative and appears stated age Head: Normocephalic, without obvious abnormality,  atraumatic Neck: no adenopathy, supple, symmetrical, trachea midline and thyroid {EXAM; THYROID:18604} Lungs: clear to auscultation bilaterally Breasts: {Exam; breast:13139::"normal appearance, no masses or tenderness"} Heart: regular rate and rhythm Abdomen: soft, non-tender; no masses,  no organomegaly Extremities: extremities normal, atraumatic, no cyanosis or edema Skin: Skin color, texture, turgor normal. No rashes or lesions Lymph nodes: Cervical, supraclavicular, and axillary nodes normal. No abnormal inguinal nodes palpated Neurologic: Grossly normal   Pelvic: External genitalia:  no lesions              Urethra:  normal appearing urethra with no masses, tenderness or lesions              Bartholin's and Skene's: normal                 Vagina: normal appearing vagina with normal color and discharge, no lesions              Cervix: {exam; cervix:14595}              Pap taken: {yes no:314532} Bimanual Exam:  Uterus:  {exam; uterus:12215}              Adnexa: {exam; adnexa:12223}               Rectovaginal: Confirms  Anus:  normal sphincter tone, no lesions  Chaperone present: ***  A:  Well Woman with normal exam  P:   Reviewed health and wellness pertinent to exam  Pap smear as above  {plan; gyn:5269::"mammogram","pap smear","return annually or prn"}  An After Visit Summary was printed and given to the patient.

## 2017-01-14 ENCOUNTER — Encounter: Payer: Self-pay | Admitting: Certified Nurse Midwife

## 2017-02-26 ENCOUNTER — Ambulatory Visit (INDEPENDENT_AMBULATORY_CARE_PROVIDER_SITE_OTHER): Payer: BLUE CROSS/BLUE SHIELD | Admitting: Certified Nurse Midwife

## 2017-02-26 ENCOUNTER — Other Ambulatory Visit (HOSPITAL_COMMUNITY)
Admission: RE | Admit: 2017-02-26 | Discharge: 2017-02-26 | Disposition: A | Discharge status: Home / Self Care | Payer: BLUE CROSS/BLUE SHIELD | Source: Ambulatory Visit | Attending: Obstetrics & Gynecology | Admitting: Obstetrics & Gynecology

## 2017-02-26 ENCOUNTER — Encounter: Payer: Self-pay | Admitting: Certified Nurse Midwife

## 2017-02-26 VITALS — BP 110/64 | HR 68 | Resp 16 | Ht 66.25 in | Wt 160.0 lb

## 2017-02-26 DIAGNOSIS — Z124 Encounter for screening for malignant neoplasm of cervix: Secondary | ICD-10-CM

## 2017-02-26 DIAGNOSIS — Z0000 Encounter for general adult medical examination without abnormal findings: Secondary | ICD-10-CM | POA: Diagnosis not present

## 2017-02-26 DIAGNOSIS — Z01419 Encounter for gynecological examination (general) (routine) without abnormal findings: Secondary | ICD-10-CM

## 2017-02-26 LAB — POCT URINALYSIS DIPSTICK
Bilirubin, UA: NEGATIVE
Blood, UA: NEGATIVE
Glucose, UA: NEGATIVE
Ketones, UA: NEGATIVE
Leukocytes, UA: NEGATIVE
Nitrite, UA: NEGATIVE
Protein, UA: NEGATIVE
Urobilinogen, UA: NEGATIVE E.U./dL — AB
pH, UA: 5 (ref 5.0–8.0)

## 2017-02-26 NOTE — Progress Notes (Signed)
36 y.o. Andrea Morris Single  Caucasian Fe here for annual exam. Periods normal,no issues. No partner change, no STD screening needed.  Very fatigued at present due fathers illness. In hospital with CHF. Patient eating well at times. Feels like my iron is low. Would screening labs today and vaginal screen only, due to history of BV. No other health issues today.  Patient's last menstrual period was 02/01/2017.          Sexually active: Yes.    The current method of family planning is tubal ligation.    Exercising: Yes.    gym Smoker:  no  Health Maintenance: Pap:  03-21-14 neg HPV HR neg History of Abnormal Pap: no MMG:  none Self Breast exams: occ Colonoscopy:  none BMD:   none TDaP:  2014 Shingles: no Pneumonia: no Hep C and HIV: HIV neg 2016 Labs: poct urine-neg   reports that she has never smoked. She has never used smokeless tobacco. She reports that she drinks alcohol. She reports that she does not use drugs.  Past Medical History:  Diagnosis Date  . Anemia   . Dyspareunia   . No pertinent past medical history     Past Surgical History:  Procedure Laterality Date  . TUBAL LIGATION  6 years ago     No current outpatient prescriptions on file.   No current facility-administered medications for this visit.     Family History  Problem Relation Age of Onset  . Diabetes Mellitus I Daughter     ROS:  Pertinent items are noted in HPI.  Otherwise, a comprehensive ROS was negative.  Exam:   BP 110/64   Pulse 68   Resp 16   Ht 5' 6.25" (1.683 m)   Wt 160 lb (72.6 kg)   LMP 02/01/2017   BMI 25.63 kg/m  Height: 5' 6.25" (168.3 cm) Ht Readings from Last 3 Encounters:  02/26/17 5' 6.25" (1.683 m)  11/29/15 5\' 6"  (1.676 m)  09/17/15 5\' 6"  (1.676 m)    General appearance: alert, cooperative and appears stated age Head: Normocephalic, without obvious abnormality, atraumatic Neck: no adenopathy, supple, symmetrical, trachea midline and thyroid normal to inspection and  palpation Lungs: clear to auscultation bilaterally Breasts: normal appearance, no masses or tenderness, No nipple retraction or dimpling, No nipple discharge or bleeding, No axillary or supraclavicular adenopathy Heart: regular rate and rhythm Abdomen: soft, non-tender; no masses,  no organomegaly Extremities: extremities normal, atraumatic, no cyanosis or edema Skin: Skin color, texture, turgor normal. No rashes or lesions Lymph nodes: Cervical, supraclavicular, and axillary nodes normal. No abnormal inguinal nodes palpated Neurologic: Grossly normal   Pelvic: External genitalia:  no lesions              Urethra:  normal appearing urethra with no masses, tenderness or lesions              Bartholin's and Skene's: normal                 Vagina: normal appearing vagina with normal color and discharge, no lesions              Cervix: no bleeding following Pap, no cervical motion tenderness and no lesions              Pap taken: Yes.   Bimanual Exam:  Uterus:  normal size, contour, position, consistency, mobility, non-tender              Adnexa: normal adnexa and no mass, fullness,  tenderness               Rectovaginal: Confirms               Anus:  normal sphincter tone, no lesions  Chaperone present: yes  A:  Well Woman with normal exam  Contraception tubal  Fatigue ? Anemia  History of chronic BV  Screening labs  P:   Reviewed health and wellness pertinent to exam  Discussed increases iron foods in diet and resting more to help with.  Labs: IBC with iron, CBC ferritin, Lipid panel, TSH, Vitamind  Pap smear: yes with vaginal screen   counseled on breast self exam, STD prevention, HIV risk factors and prevention, adequate intake of calcium and vitamin D, diet and exercise  return annually or prn  An After Visit Summary was printed and given to the patient.

## 2017-02-26 NOTE — Patient Instructions (Signed)

## 2017-02-27 LAB — FERRITIN: Ferritin: 13 ng/mL — ABNORMAL LOW (ref 15–150)

## 2017-02-27 LAB — CBC
Hematocrit: 35.4 % (ref 34.0–46.6)
Hemoglobin: 11.4 g/dL (ref 11.1–15.9)
MCH: 28.7 pg (ref 26.6–33.0)
MCHC: 32.2 g/dL (ref 31.5–35.7)
MCV: 89 fL (ref 79–97)
Platelets: 287 10*3/uL (ref 150–379)
RBC: 3.97 x10E6/uL (ref 3.77–5.28)
RDW: 13.5 % (ref 12.3–15.4)
WBC: 7.4 10*3/uL (ref 3.4–10.8)

## 2017-02-27 LAB — IRON AND TIBC
Iron Saturation: 16 % (ref 15–55)
Iron: 50 ug/dL (ref 27–159)
Total Iron Binding Capacity: 315 ug/dL (ref 250–450)
UIBC: 265 ug/dL (ref 131–425)

## 2017-02-27 LAB — LIPID PANEL
Chol/HDL Ratio: 2.7 ratio (ref 0.0–4.4)
Cholesterol, Total: 167 mg/dL (ref 100–199)
HDL: 62 mg/dL (ref >39)
LDL Calculated: 94 mg/dL (ref 0–99)
Triglycerides: 55 mg/dL (ref 0–149)
VLDL Cholesterol Cal: 11 mg/dL (ref 5–40)

## 2017-02-27 LAB — TSH: TSH: 1.69 u[IU]/mL (ref 0.450–4.500)

## 2017-02-27 LAB — VITAMIN D 25 HYDROXY (VIT D DEFICIENCY, FRACTURES): Vit D, 25-Hydroxy: 30.2 ng/mL (ref 30.0–100.0)

## 2017-02-28 ENCOUNTER — Other Ambulatory Visit: Payer: Self-pay | Admitting: Certified Nurse Midwife

## 2017-02-28 DIAGNOSIS — R899 Unspecified abnormal finding in specimens from other organs, systems and tissues: Secondary | ICD-10-CM

## 2017-03-01 ENCOUNTER — Telehealth: Payer: Self-pay | Admitting: *Deleted

## 2017-03-01 LAB — CYTOLOGY - PAP: Diagnosis: NEGATIVE

## 2017-03-01 NOTE — Telephone Encounter (Signed)
-----   Message from Regina Eck, CNM sent at 02/28/2017  9:21 PM EDT ----- Notify patient that TSH is normal Vitamin D is borderline low, needs to start on Vitamin D 3 1000 IU daily,this should help with fatigue. Lipid panel: Cholesterol, triglyceride HDL,LDL all normal, CBC is normal, but lower than past year, start on OTC Multivitamin with iron this should help with fatigue also. Iron profile is normal with good saturation, but Ferritin is low at 13, should be > 15. Add iron foods to diet also, that and with Multivitamin with iron should help this. Recheck in 3 months order placed, please schedule

## 2017-03-01 NOTE — Telephone Encounter (Signed)
Message left to return call to Bailynn Dyk at [phone].    

## 2017-03-01 NOTE — Telephone Encounter (Signed)
Patient returned call

## 2017-03-02 NOTE — Telephone Encounter (Signed)
Message left to return call to Regan Llorente at [phone].    

## 2017-03-02 NOTE — Telephone Encounter (Signed)
Patient returned call. All results reviewed with patient and she verbalized understanding. Aware to incorporate vitamin d 1000 IU and multivitamin with iron to daily regimen. 3 month recheck scheduled for Wednesday 06/02/17 at 1530. Future order present for lab work. 02 recall- aex scheduled for 03/01/18.

## 2017-03-02 NOTE — Telephone Encounter (Signed)
-----   Message from Regina Eck, CNM sent at 03/01/2017  6:22 PM EDT ----- Pap smear negative 02

## 2017-06-02 ENCOUNTER — Telehealth: Payer: Self-pay | Admitting: Certified Nurse Midwife

## 2017-06-02 ENCOUNTER — Other Ambulatory Visit: Payer: BLUE CROSS/BLUE SHIELD

## 2017-06-02 NOTE — Telephone Encounter (Signed)
Thank you :)

## 2017-06-02 NOTE — Telephone Encounter (Signed)
Called patient regarding missed lab appointment. Her grandfather died and she is out of town. Will call back to reschedule.

## 2017-12-06 ENCOUNTER — Encounter (HOSPITAL_COMMUNITY): Payer: Self-pay | Admitting: Emergency Medicine

## 2017-12-06 ENCOUNTER — Ambulatory Visit (HOSPITAL_COMMUNITY)
Admission: EM | Admit: 2017-12-06 | Discharge: 2017-12-06 | Disposition: A | Discharge status: Home / Self Care | Payer: BLUE CROSS/BLUE SHIELD | Attending: Internal Medicine | Admitting: Internal Medicine

## 2017-12-06 DIAGNOSIS — J020 Streptococcal pharyngitis: Secondary | ICD-10-CM

## 2017-12-06 LAB — POCT RAPID STREP A: Streptococcus, Group A Screen (Direct): POSITIVE — AB

## 2017-12-06 MED ORDER — AMOXICILLIN 500 MG PO CAPS: 500.0000 mg | ORAL_CAPSULE | Freq: Two times a day (BID) | ORAL | 0 refills | Status: AC

## 2017-12-06 MED ORDER — FLUTICASONE PROPIONATE 50 MCG/ACT NA SUSP: 1.0000 | Freq: Every day | NASAL | 0 refills | Status: DC

## 2017-12-06 NOTE — Discharge Instructions (Signed)

## 2017-12-06 NOTE — ED Triage Notes (Signed)
Pt here for sore throat and chills

## 2017-12-06 NOTE — ED Provider Notes (Signed)
[facility]    CSN: [id number] Arrival date & time: 12/06/17  0957     History   Chief Complaint Chief Complaint  Patient presents with  . Sore Throat    HPI Andrea Morris is a 37 y.o. female no contributing history presenting today for evaluation of sore throat.  Patient sore throat began on Saturday, approximately 2 days ago.  Said she has had chills, night sweats, ear pain and some congestion.  Denies cough.  Has been taking TheraFlu, Alka-Seltzer with minimal relief.  Patient also takes daily allergy pill.  Because she has issues with her sinuses frequently.  Denies any nausea, vomiting, abdominal pain.  HPI  Past Medical History:  Diagnosis Date  . Anemia   . Dyspareunia   . No pertinent past medical history     Patient Active Problem List   Diagnosis Date Noted  . Menorrhagia with regular cycle 03/21/2014  . Absolute anemia 03/21/2014    Past Surgical History:  Procedure Laterality Date  . TUBAL LIGATION  6 years ago     OB History    Gravida  5   Para  5   Term  5   Preterm      AB      Living  5     SAB      TAB      Ectopic      Multiple      Live Births               Home Medications    Prior to Admission medications   Medication Sig Start Date End Date Taking? Authorizing Provider  amoxicillin (AMOXIL) 500 MG capsule Take 1 capsule (500 mg total) by mouth 2 (two) times daily for 10 days. 12/06/17 12/16/17  Keiona Jenison C, PA-C  fluticasone (FLONASE) 50 MCG/ACT nasal spray Place 1 spray into both nostrils daily for 7 days. 12/06/17 12/13/17  Natayla Cadenhead, Elesa Hacker, PA-C    Family History Family History  Problem Relation Age of Onset  . Diabetes Mellitus I Daughter     Social History Social History   Tobacco Use  . Smoking status: Never Smoker  . Smokeless tobacco: Never Used  Substance Use Topics  . Alcohol use: Yes    Alcohol/week: 0.0 - 1.2 oz  . Drug use: No     Allergies   Patient has no known  allergies.   Review of Systems Review of Systems  Constitutional: Positive for chills and fever. Negative for appetite change and fatigue.  HENT: Positive for congestion, ear pain, rhinorrhea and sore throat. Negative for sinus pressure and trouble swallowing.   Respiratory: Negative for cough, chest tightness and shortness of breath.   Cardiovascular: Negative for chest pain.  Gastrointestinal: Negative for abdominal pain, nausea and vomiting.  Musculoskeletal: Negative for myalgias.  Skin: Negative for rash.  Neurological: Negative for dizziness, light-headedness and headaches.     Physical Exam Triage Vital Signs ED Triage Vitals [12/06/17 1008]  Enc Vitals Group     BP 116/80     Pulse Rate 98     Resp 16     Temp 98 F (36.7 C)     Temp Source Oral     SpO2 100 %     Weight      Height      Head Circumference      Peak Flow      Pain Score      Pain Loc  Pain Edu?      Excl. in [place]?    No data found.  Updated Vital Signs BP 116/80 (BP Location: Left Arm)   Pulse 98   Temp 98 F (36.7 C) (Oral)   Resp 16   SpO2 100%   Visual Acuity Right Eye Distance:   Left Eye Distance:   Bilateral Distance:    Right Eye Near:   Left Eye Near:    Bilateral Near:     Physical Exam  Constitutional: She appears well-developed and well-nourished. No distress.  HENT:  Head: Normocephalic and atraumatic.  Bilateral TMs nonerythematous, nasal mucosa nonerythematous, no rhinorrhea present, posterior oropharynx erythematous, bilateral tonsils with mild enlargement, white exudate present bilaterally and diffusely over tonsils.  No uvula swelling or deviation  Eyes: Conjunctivae are normal.  Neck: Neck supple.  Cardiovascular: Normal rate and regular rhythm.  No murmur heard. Pulmonary/Chest: Effort normal and breath sounds normal. No respiratory distress.  Breathing comfortably at rest, CTABL  Musculoskeletal: She exhibits no edema.  Neurological: She is alert.    Skin: Skin is warm and dry.  Psychiatric: She has a normal mood and affect.  Nursing note and vitals reviewed.    UC Treatments / Results  Labs (all labs ordered are listed, but only abnormal results are displayed) Labs Reviewed  POCT RAPID STREP A - Abnormal; Notable for the following components:      Result Value   Streptococcus, Group A Screen (Direct) POSITIVE (*)    All other components within normal limits    EKG None Radiology No results found.  Procedures Procedures (including critical care time)  Medications Ordered in UC Medications - No data to display   Initial Impression / Assessment and Plan / UC Course  I have reviewed the triage vital signs and the nursing notes.  Pertinent labs & imaging results that were available during my care of the patient were reviewed by me and considered in my medical decision making (see chart for details).     Patient tested positive for strep. No evidence of peritonsillar abscess or retropharyngeal abscess. Patient is nontoxic appearing, no drooling, dysphagia, muffled voice, or tripoding. No trismus.  Amoxicillin 500 twice daily x10 days.  Also discussed further recommendations for soothing sore throat as antibiotic begins to work. Discussed strict return precautions. Patient verbalized understanding and is agreeable with plan.    Final Clinical Impressions(s) / UC Diagnoses   Final diagnoses:  Strep throat    ED Discharge Orders        Ordered    amoxicillin (AMOXIL) 500 MG capsule  2 times daily     12/06/17 1024    fluticasone (FLONASE) 50 MCG/ACT nasal spray  Daily     12/06/17 1024       Controlled Substance Prescriptions [place] Controlled Substance Registry consulted? Not Applicable   Janith Lima, [place] 12/06/17 1029

## 2018-03-01 ENCOUNTER — Encounter: Payer: Self-pay | Admitting: Certified Nurse Midwife

## 2018-03-01 ENCOUNTER — Ambulatory Visit (INDEPENDENT_AMBULATORY_CARE_PROVIDER_SITE_OTHER): Payer: BLUE CROSS/BLUE SHIELD | Admitting: Certified Nurse Midwife

## 2018-03-01 ENCOUNTER — Other Ambulatory Visit: Payer: Self-pay

## 2018-03-01 VITALS — BP 100/68 | HR 64 | Resp 16 | Ht 65.75 in | Wt 165.0 lb

## 2018-03-01 DIAGNOSIS — Z01419 Encounter for gynecological examination (general) (routine) without abnormal findings: Secondary | ICD-10-CM

## 2018-03-01 DIAGNOSIS — N920 Excessive and frequent menstruation with regular cycle: Secondary | ICD-10-CM | POA: Diagnosis not present

## 2018-03-01 DIAGNOSIS — R5383 Other fatigue: Secondary | ICD-10-CM | POA: Diagnosis not present

## 2018-03-01 DIAGNOSIS — E559 Vitamin D deficiency, unspecified: Secondary | ICD-10-CM

## 2018-03-01 NOTE — Patient Instructions (Signed)

## 2018-03-01 NOTE — Progress Notes (Signed)
37 y.o. [id number] Married  African American Fe here for annual exam. Periods have been heavier in the past two months. 4 heavy days with changing pads through night. Duration 7 days. Cramping no change. Some fatigue with heavier period. Taking Prenatal vitamins daily. Has noted ovulation discharge, but using baking soda tub bath for vaginal itching, which works well. Some depression with Dad passing in 2/19 and mother now with rectal cancer. No other health issues today.  Patient's last menstrual period was 02/16/2018 (exact date).          Sexually active: Yes.    The current method of family planning is tubal ligation.    Exercising: Yes.    workout Smoker:  no  Health Maintenance: Pap:  03-21-14 neg HPV HR neg, 02-26-17 neg History of Abnormal Pap: no MMG:  none Self Breast exams: no Colonoscopy:  none BMD:   none TDaP:  2014 Shingles: no Pneumonia: no Hep C and HIV: HIV neg 2016 Labs: yes   reports that she has never smoked. She has never used smokeless tobacco. She reports that she drinks about 1.2 oz of alcohol per week. She reports that she does not use drugs.  Past Medical History:  Diagnosis Date  . Anemia   . Dyspareunia   . No pertinent past medical history     Past Surgical History:  Procedure Laterality Date  . TUBAL LIGATION  6 years ago     Current Outpatient Medications  Medication Sig Dispense Refill  . fluticasone (FLONASE) 50 MCG/ACT nasal spray Place into both nostrils daily.     No current facility-administered medications for this visit.     Family History  Problem Relation Age of Onset  . Diabetes Mellitus I Daughter   . Rectal cancer Mother     ROS:  Pertinent items are noted in HPI.  Otherwise, a comprehensive ROS was negative.  Exam:   BP 100/68   Pulse 64   Resp 16   Ht 5' 5.75" (1.67 m)   Wt 165 lb (74.8 kg)   LMP 02/16/2018 (Exact Date)   BMI 26.83 kg/m  Height: 5' 5.75" (167 cm) Ht Readings from Last 3 Encounters:  03/01/18 5'  5.75" (1.67 m)  02/26/17 5' 6.25" (1.683 m)  11/29/15 5\' 6"  (1.676 m)    General appearance: alert, cooperative and appears stated age Head: Normocephalic, without obvious abnormality, atraumatic Neck: no adenopathy, supple, symmetrical, trachea midline and thyroid normal to inspection and palpation Lungs: clear to auscultation bilaterally Breasts: normal appearance, no masses or tenderness, No nipple retraction or dimpling, No nipple discharge or bleeding, No axillary or supraclavicular adenopathy Heart: regular rate and rhythm Abdomen: soft, non-tender; no masses,  no organomegaly Extremities: extremities normal, atraumatic, no cyanosis or edema Skin: Skin color, texture, turgor normal. No rashes or lesions Lymph nodes: Cervical, supraclavicular, and axillary nodes normal. No abnormal inguinal nodes palpated Neurologic: Grossly normal   Pelvic: External genitalia:  no lesions              Urethra:  normal appearing urethra with no masses, tenderness or lesions              Bartholin's and Skene's: normal                 Vagina: normal appearing vagina with normal color and discharge, no lesions              Cervix: multiparous appearance, no cervical motion tenderness and no lesions  Pap taken: No. Bimanual Exam:  Uterus:  normal size, contour, position, consistency, mobility, non-tender and mid position              Adnexa: normal adnexa and no mass, fullness, tenderness               Rectovaginal: Confirms               Anus:  normal sphincter tone, no lesions  Chaperone present: yes  A:  Well Woman with normal exam  Contraception BTL  Menses change with some menorrhagia for past 2 months with fatigue occurring  History of Vitamin D deficiency  Social stress with fathers death and mother with rectal cancer    P:   Reviewed health and wellness pertinent to exam  Discussed keeping menses calendar and advise if periods continue to be heavy. Warning signs given.  Work on iron foods in diet and make sure to drink adequate water intake  R/O anemia/vitamin D deficiency  Lab: CBC, TIBC, Vitamin D  Stressed seeking support from family and friends as needed.  Pap smear: no   counseled on breast self exam, STD prevention, HIV risk factors and prevention, feminine hygiene, adequate intake of calcium and vitamin D, diet and exercise  return annually or prn  An After Visit Summary was printed and given to the patient.

## 2018-03-02 ENCOUNTER — Other Ambulatory Visit: Payer: Self-pay | Admitting: Certified Nurse Midwife

## 2018-03-02 DIAGNOSIS — D509 Iron deficiency anemia, unspecified: Secondary | ICD-10-CM

## 2018-03-02 DIAGNOSIS — E559 Vitamin D deficiency, unspecified: Secondary | ICD-10-CM

## 2018-03-02 LAB — CBC
Hematocrit: 35.3 % (ref 34.0–46.6)
Hemoglobin: 11.6 g/dL (ref 11.1–15.9)
MCH: 29.5 pg (ref 26.6–33.0)
MCHC: 32.9 g/dL (ref 31.5–35.7)
MCV: 90 fL (ref 79–97)
Platelets: 304 10*3/uL (ref 150–450)
RBC: 3.93 x10E6/uL (ref 3.77–5.28)
RDW: 14.1 % (ref 12.3–15.4)
WBC: 8.5 10*3/uL (ref 3.4–10.8)

## 2018-03-02 LAB — IRON AND TIBC
Iron Saturation: 4 % — CL (ref 15–55)
Iron: 14 ug/dL — ABNORMAL LOW (ref 27–159)
Total Iron Binding Capacity: 313 ug/dL (ref 250–450)
UIBC: 299 ug/dL (ref 131–425)

## 2018-03-02 LAB — VITAMIN D 25 HYDROXY (VIT D DEFICIENCY, FRACTURES): Vit D, 25-Hydroxy: 26.1 ng/mL — ABNORMAL LOW (ref 30.0–100.0)

## 2018-03-03 ENCOUNTER — Telehealth: Payer: Self-pay

## 2018-03-03 ENCOUNTER — Other Ambulatory Visit: Payer: Self-pay

## 2018-03-03 MED ORDER — FUSION PLUS PO CAPS: 1.0000 | ORAL_CAPSULE | Freq: Every day | ORAL | 2 refills | Status: DC

## 2018-03-03 NOTE — Telephone Encounter (Signed)
Patient notified of results as written by provider 

## 2018-03-03 NOTE — Telephone Encounter (Signed)
Patient is returning a call to Joy. °

## 2018-03-03 NOTE — Telephone Encounter (Signed)
Mailbox full

## 2018-05-04 ENCOUNTER — Other Ambulatory Visit: Payer: BLUE CROSS/BLUE SHIELD

## 2018-05-12 ENCOUNTER — Encounter: Payer: Self-pay | Admitting: Certified Nurse Midwife

## 2018-05-12 ENCOUNTER — Other Ambulatory Visit: Payer: Self-pay

## 2018-05-12 ENCOUNTER — Ambulatory Visit (INDEPENDENT_AMBULATORY_CARE_PROVIDER_SITE_OTHER): Payer: BLUE CROSS/BLUE SHIELD | Admitting: Certified Nurse Midwife

## 2018-05-12 VITALS — BP 98/64 | HR 68 | Temp 98.0°F | Resp 16 | Wt 161.0 lb

## 2018-05-12 DIAGNOSIS — N898 Other specified noninflammatory disorders of vagina: Secondary | ICD-10-CM

## 2018-05-12 DIAGNOSIS — N3001 Acute cystitis with hematuria: Secondary | ICD-10-CM | POA: Diagnosis not present

## 2018-05-12 MED ORDER — NITROFURANTOIN MONOHYD MACRO 100 MG PO CAPS: ORAL_CAPSULE | ORAL | 0 refills | Status: DC

## 2018-05-12 NOTE — Progress Notes (Signed)
37 y.o. Married [place] American female [phone] here with complaint of vaginal symptoms of itching, burning, and increase discharge with yellow color. Also having urinary frequency with odorous urine and some low abdominal discomfort. Treated self with Ampicillin for UTI ( dad Rx.), which helped, but did not resolve. Treated self with  OTC Monistat, symptoms were less, but did not go away. Ready to feel better! Started period this am, so some cramping from menses.  Onset of symptoms of UTI and vaginal itching 2 -3 weeks days ago. Denies new personal products or vaginal dryness. Denies fever, chills, headache or blood in urine. No STD concerns but desires screening.. . . Contraception is BTL.  Review of Systems  Constitutional: Negative.   HENT: Negative.   Eyes: Negative.   Respiratory: Negative.   Cardiovascular: Negative.   Gastrointestinal: Negative.   Genitourinary: Positive for frequency.       Urinary frequency, vaginal itching, burning with urination  Musculoskeletal: Negative.   Skin: Negative.   Neurological: Negative.   Endo/Heme/Allergies: Negative.   Psychiatric/Behavioral: Negative.     O:Healthy female WDWN Affect: normal, orientation x 3  Exam: Skin : warm and dry CVAT bilateral negative. Abdomen:soft, no masses, positive suprapubic tenderness  Inguinal Lymph nodes: no enlargement or tenderness Pelvic exam: External genital: normal female slight edema of vulva and redness, no scaling or exudate noted or blisters BUS: negative Bladder and urethral meatus tender Vagina: moderate amount of blood  Noted from period     Affirm taken Cervix: normal, non tender, no CMT, blood noted from cervix Uterus: normal, non tender Adnexa:normal, non tender, no masses or fullness noted Rectal area: no lesions or redness, normal appearance   A:Normal pelvic exam Contraception BTL UTI R/O vaginal infection STD screening   P:Discussed findings of Normal pelvic exam and unable to  determine if vaginal infection due period blood. Will await lab results and treat if indicated. Discussed period helps with cleansing vagina and ph change, so may not need medication. Lab: Affirm  Discussed UTI consistent with symptoms and will need treatment. Rx Macrobid see order with instructions Reviewed warning signs with UTI and need to advise if occurs. Increase water intake and decrease soda use. Lab: Urine culture, micro Discussed condom use for STD protection. Labs: GC,Chlamydia  Rv prn

## 2018-05-13 LAB — URINALYSIS, MICROSCOPIC ONLY
Casts: NONE SEEN /lpf
RBC, UA: >30 /hpf — AB (ref 0–2)
WBC, UA: >30 /hpf — AB (ref 0–5)

## 2018-05-13 LAB — VAGINITIS/VAGINOSIS, DNA PROBE
Candida Species: NEGATIVE
Gardnerella vaginalis: NEGATIVE
Trichomonas vaginosis: NEGATIVE

## 2018-05-13 LAB — GC/CHLAMYDIA PROBE AMP
Chlamydia trachomatis, NAA: NEGATIVE
Neisseria gonorrhoeae by PCR: NEGATIVE

## 2018-05-14 LAB — URINE CULTURE

## 2018-10-09 ENCOUNTER — Emergency Department (HOSPITAL_COMMUNITY)
Admission: EM | Admit: 2018-10-09 | Discharge: 2018-10-09 | Disposition: A | Discharge status: Home / Self Care | Payer: BLUE CROSS/BLUE SHIELD | Attending: Emergency Medicine | Admitting: Emergency Medicine

## 2018-10-09 ENCOUNTER — Other Ambulatory Visit: Payer: Self-pay

## 2018-10-09 ENCOUNTER — Encounter (HOSPITAL_COMMUNITY): Payer: Self-pay | Admitting: *Deleted

## 2018-10-09 ENCOUNTER — Emergency Department (HOSPITAL_COMMUNITY): Payer: BLUE CROSS/BLUE SHIELD

## 2018-10-09 DIAGNOSIS — Y9389 Activity, other specified: Secondary | ICD-10-CM | POA: Diagnosis not present

## 2018-10-09 DIAGNOSIS — S161XXA Strain of muscle, fascia and tendon at neck level, initial encounter: Secondary | ICD-10-CM | POA: Diagnosis present

## 2018-10-09 DIAGNOSIS — Y999 Unspecified external cause status: Secondary | ICD-10-CM | POA: Diagnosis not present

## 2018-10-09 DIAGNOSIS — S40011A Contusion of right shoulder, initial encounter: Secondary | ICD-10-CM | POA: Diagnosis not present

## 2018-10-09 DIAGNOSIS — Y929 Unspecified place or not applicable: Secondary | ICD-10-CM | POA: Diagnosis not present

## 2018-10-09 DIAGNOSIS — S7002XA Contusion of left hip, initial encounter: Secondary | ICD-10-CM

## 2018-10-09 LAB — I-STAT BETA HCG BLOOD, ED (MC, WL, AP ONLY): I-stat hCG, quantitative: <5 m[IU]/mL (ref <5)

## 2018-10-09 MED ORDER — HYDROCODONE-ACETAMINOPHEN 5-325 MG PO TABS: 1.0000 | ORAL_TABLET | Freq: Four times a day (QID) | ORAL | 0 refills | Status: DC | PRN

## 2018-10-09 MED ORDER — HYDROCODONE-ACETAMINOPHEN 5-325 MG PO TABS
2.0000 | ORAL_TABLET | Freq: Once | ORAL | Status: AC
  Administered 2018-10-09: 2 via ORAL
  Filled 2018-10-09: qty 2
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
## 2018-10-09 NOTE — Discharge Instructions (Addendum)
Ibuprofen 600 mg every 6 hours as needed for pain.  Hydrocodone as needed for pain not relieved with ibuprofen.  Follow-up with your primary doctor if symptoms or not improving in the next 3 to 4 days, and return to the ER if symptoms significantly worsen or change.
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
## 2018-10-09 NOTE — ED Notes (Signed)
See triage note.

## 2019-03-03 ENCOUNTER — Ambulatory Visit: Payer: BLUE CROSS/BLUE SHIELD | Admitting: Certified Nurse Midwife

## 2019-03-28 ENCOUNTER — Ambulatory Visit: Payer: BLUE CROSS/BLUE SHIELD | Admitting: Certified Nurse Midwife

## 2019-09-30 ENCOUNTER — Emergency Department (HOSPITAL_COMMUNITY): Payer: Self-pay

## 2019-09-30 ENCOUNTER — Other Ambulatory Visit: Payer: Self-pay

## 2019-09-30 ENCOUNTER — Encounter (HOSPITAL_COMMUNITY): Payer: Self-pay | Admitting: Emergency Medicine

## 2019-09-30 ENCOUNTER — Emergency Department (HOSPITAL_COMMUNITY)
Admission: EM | Admit: 2019-09-30 | Discharge: 2019-09-30 | Disposition: A | Discharge status: Home / Self Care | Payer: Self-pay | Attending: Emergency Medicine | Admitting: Emergency Medicine

## 2019-09-30 DIAGNOSIS — Z20822 Contact with and (suspected) exposure to covid-19: Secondary | ICD-10-CM | POA: Insufficient documentation

## 2019-09-30 DIAGNOSIS — Z79899 Other long term (current) drug therapy: Secondary | ICD-10-CM | POA: Insufficient documentation

## 2019-09-30 DIAGNOSIS — R0789 Other chest pain: Secondary | ICD-10-CM | POA: Insufficient documentation

## 2019-09-30 LAB — BASIC METABOLIC PANEL
Anion gap: 10 (ref 5–15)
BUN: 9 mg/dL (ref 6–20)
CO2: 22 mmol/L (ref 22–32)
Calcium: 9.2 mg/dL (ref 8.9–10.3)
Chloride: 107 mmol/L (ref 98–111)
Creatinine, Ser: 0.85 mg/dL (ref 0.44–1.00)
GFR calc Af Amer: >60 mL/min (ref >60)
GFR calc non Af Amer: >60 mL/min (ref >60)
Glucose, Bld: 94 mg/dL (ref 70–99)
Potassium: 4 mmol/L (ref 3.5–5.1)
Sodium: 139 mmol/L (ref 135–145)

## 2019-09-30 LAB — CBC
HCT: 36.2 % (ref 36.0–46.0)
Hemoglobin: 11.6 g/dL — ABNORMAL LOW (ref 12.0–15.0)
MCH: 29.1 pg (ref 26.0–34.0)
MCHC: 32 g/dL (ref 30.0–36.0)
MCV: 90.7 fL (ref 80.0–100.0)
Platelets: 225 10*3/uL (ref 150–400)
RBC: 3.99 MIL/uL (ref 3.87–5.11)
RDW: 13.5 % (ref 11.5–15.5)
WBC: 7.4 10*3/uL (ref 4.0–10.5)
nRBC: 0 % (ref 0.0–0.2)

## 2019-09-30 LAB — TROPONIN I (HIGH SENSITIVITY): Troponin I (High Sensitivity): 5 ng/L (ref <18)

## 2019-09-30 LAB — SARS CORONAVIRUS 2 (TAT 6-24 HRS): SARS Coronavirus 2: NEGATIVE

## 2019-09-30 MED ORDER — PREDNISONE 10 MG PO TABS: 40.0000 mg | ORAL_TABLET | Freq: Every day | ORAL | 0 refills | Status: AC

## 2019-09-30 NOTE — ED Triage Notes (Signed)
Pt. Stated, I started having chest pain 2 days continuous.

## 2019-09-30 NOTE — ED Notes (Signed)
Patient Alert and oriented to baseline. Stable and ambulatory to baseline. Patient verbalized understanding of the discharge instructions.  Patient belongings were taken by the patient.   

## 2019-09-30 NOTE — Discharge Instructions (Signed)
Thank you for allowing me to care for you today. Please return to the emergency department if you have new or worsening symptoms. Take your medications as instructed.  ° °

## 2019-09-30 NOTE — ED Provider Notes (Signed)
[facility] EMERGENCY DEPARTMENT Provider Note   CSN: [id number] Arrival date & time: 09/30/19  [id number]     History Chief Complaint  Patient presents with  . Chest Pain    Andrea Morris is a 39 y.o. female.  Patient is a 39 year old female with past medical history of anemia presenting to the emergency department for chest pain.  Patient reports that 2 days ago she began to have intermittent chest pain which then became constant sometime yesterday afternoon.  She reports that it feels like pressure and aching.  Reports that in the middle of the night she has woken up and felt short of breath.  Also reports that it seems to be worse when she tries to swallow.  She reports she has had a dry mouth and intermittent ear pain and cough as well.  She denies any fever, chills, nausea, vomiting, leg swelling, recent immobilization.  She has had a family history of her father having a heart attack in his forties.  She denies history of smoking.        Past Medical History:  Diagnosis Date  . Anemia   . Dyspareunia   . No pertinent past medical history     Patient Active Problem List   Diagnosis Date Noted  . Menorrhagia with regular cycle 03/21/2014  . Absolute anemia 03/21/2014    Past Surgical History:  Procedure Laterality Date  . TUBAL LIGATION  6 years ago      OB History    Gravida  5   Para  5   Term  5   Preterm      AB      Living  5     SAB      TAB      Ectopic      Multiple      Live Births              Family History  Problem Relation Age of Onset  . Diabetes Mellitus I Daughter   . Rectal cancer Mother     Social History   Tobacco Use  . Smoking status: Never Smoker  . Smokeless tobacco: Never Used  Substance Use Topics  . Alcohol use: Yes    Alcohol/week: 2.0 standard drinks    Types: 2 Standard drinks or equivalent per week  . Drug use: No    Home Medications Prior to Admission medications   Medication  Sig Start Date End Date Taking? Authorizing Provider  Cholecalciferol (VITAMIN D PO) Take by mouth.    [provider]  HYDROcodone-acetaminophen (NORCO) 5-325 MG tablet Take 1-2 tablets by mouth every 6 (six) hours as needed. 10/09/18   Veryl Speak, MD  Iron-FA-B Cmp-C-Biot-Probiotic (FUSION PLUS) CAPS Take 1 capsule by mouth daily. 03/03/18   Regina Eck, CNM  Multiple Vitamins-Minerals (MULTIVITAMIN PO) Take by mouth.    [provider]  nitrofurantoin, macrocrystal-monohydrate, (MACROBID) 100 MG capsule Take one capsule BID x 7 days 05/12/18   Regina Eck, CNM  predniSONE (DELTASONE) 10 MG tablet Take 4 tablets (40 mg total) by mouth daily for 5 days. 09/30/19 10/05/19  Alveria Apley, PA-C    Allergies    Patient has no known allergies.  Review of Systems   Review of Systems  Constitutional: Negative for appetite change, chills, fatigue and fever.  HENT: Positive for congestion, ear pain and sore throat.   Respiratory: Positive for cough and shortness of breath.   Cardiovascular: Negative  for chest pain, palpitations and leg swelling.  Gastrointestinal: Negative for abdominal pain, nausea and vomiting.  Genitourinary: Negative for dysuria.  Musculoskeletal: Negative for back pain.  Skin: Negative for rash.  Neurological: Positive for light-headedness. Negative for dizziness.  All other systems reviewed and are negative.   Physical Exam Updated Vital Signs BP 110/74   Pulse 75   Temp 98 F (36.7 C) (Oral)   Resp 14   Ht 5\' 6"  (1.676 m)   Wt 77.1 kg   LMP 09/11/2019   SpO2 97%   BMI 27.44 kg/m   Physical Exam Vitals and nursing note reviewed.  Constitutional:      General: She is not in acute distress.    Appearance: Normal appearance. She is well-developed. She is not ill-appearing, toxic-appearing or diaphoretic.  HENT:     Head: Normocephalic.     Mouth/Throat:     Mouth: Mucous membranes are moist.     Pharynx: No oropharyngeal  exudate or posterior oropharyngeal erythema.  Eyes:     Conjunctiva/sclera: Conjunctivae normal.     Pupils: Pupils are equal, round, and reactive to light.  Cardiovascular:     Rate and Rhythm: Normal rate and regular rhythm.     Heart sounds: Normal heart sounds.  Pulmonary:     Effort: Pulmonary effort is normal.     Breath sounds: Normal breath sounds.  Skin:    General: Skin is warm and dry.     Findings: No rash.  Neurological:     Mental Status: She is alert.  Psychiatric:        Mood and Affect: Mood normal.     ED Results / Procedures / Treatments   Labs (all labs ordered are listed, but only abnormal results are displayed) Labs Reviewed  CBC - Abnormal; Notable for the following components:      Result Value   Hemoglobin 11.6 (*)    All other components within normal limits  SARS CORONAVIRUS 2 (TAT 6-24 HRS)  BASIC METABOLIC PANEL  TROPONIN I (HIGH SENSITIVITY)  TROPONIN I (HIGH SENSITIVITY)    EKG None  Radiology DG Chest Port 1 View  Result Date: 09/30/2019 CLINICAL DATA:  Chest pain for 2 days EXAM: PORTABLE CHEST 1 VIEW COMPARISON:  11/29/2015 FINDINGS: Normal heart size and mediastinal contours. No acute infiltrate or edema. No effusion or pneumothorax. Mild dextrocurvature. No acute osseous findings. IMPRESSION: No active disease. Electronically Signed   By: Monte Fantasia M.D.   On: 09/30/2019 09:03    Procedures Procedures (including critical care time)  Medications Ordered in ED Medications - No data to display  ED Course  I have reviewed the triage vital signs and the nursing notes.  Pertinent labs & imaging results that were available during my care of the patient were reviewed by me and considered in my medical decision making (see chart for details).  Clinical Course as of Sep 29 1028  Sat Sep 30, 2019  0955 Patient with a heart score of 1, PERC negative presenting with 2 days of chest pain which became constant yesterday afternoon.   Patient appearing very well on exam with normal vital signs.  Patient also having some accompanying URI symptoms including sore throat and ear pain.  Her initial troponin was 5 and her remaining labs are unremarkable.  EKG and chest x-ray also unremarkable.  I have low suspicion for ACS and given that her chest pain has been going on for almost 24 hours with a troponin of  5 I feel patient is safe for discharge with close primary care doctor follow-up.  She was advised on strict return precautions.  We will do a send out Covid test given her accompanying URI symptoms. She requested a prescription for "something for my sinuses" and reports chronic sinus problems. Will rx prednisone short course and advise OTC medications.   [KM]  [id number] ED ECG REPORT   Date: @EDTODAY @  Rate: 85  Rhythm: normal sinus rhythm  QRS Axis: right  Intervals: normal  ST/T Wave abnormalities: normal  Conduction Disutrbances:none  Narrative Interpretation:   Old EKG Reviewed: none available  I have personally reviewed the EKG tracing and agree with the computerized printout as noted.    [KM]    Clinical Course User Index [KM] Kristine Royal   MDM Rules/Calculators/A&P                      Based on review of vitals, medical screening exam, lab work and/or imaging, there does not appear to be an acute, emergent etiology for the patient's symptoms. Counseled pt on good return precautions and encouraged both PCP and ED follow-up as needed.  Prior to discharge, I also discussed incidental imaging findings with patient in detail and advised appropriate, recommended follow-up in detail.  Clinical Impression: 1. Atypical chest pain     Disposition: Discharge  Prior to providing a prescription for a controlled substance, I independently reviewed the patient's recent prescription history on the [facility]. The patient had no recent or regular prescriptions and was deemed  appropriate for a brief, less than 3 day prescription of narcotic for acute analgesia.  This note was prepared with assistance of Systems analyst. Occasional wrong-word or sound-a-like substitutions may have occurred due to the inherent limitations of voice recognition software.  Final Clinical Impression(s) / ED Diagnoses Final diagnoses:  Atypical chest pain    Rx / DC Orders ED Discharge Orders         Ordered    predniSONE (DELTASONE) 10 MG tablet  Daily     09/30/19 1029           Kristine Royal 09/30/19 1030    Quintella Reichert, MD 10/01/19 [phone]

## 2019-09-30 NOTE — ED Notes (Signed)
Got patient on the monitor patient is resting with call bell in reach  ?

## 2019-10-22 ENCOUNTER — Emergency Department (HOSPITAL_COMMUNITY)
Admission: EM | Admit: 2019-10-22 | Discharge: 2019-10-22 | Disposition: A | Discharge status: Home / Self Care | Payer: Self-pay | Attending: Emergency Medicine | Admitting: Emergency Medicine

## 2019-10-22 ENCOUNTER — Emergency Department (HOSPITAL_COMMUNITY): Payer: Self-pay

## 2019-10-22 DIAGNOSIS — M24411 Recurrent dislocation, right shoulder: Secondary | ICD-10-CM | POA: Insufficient documentation

## 2019-10-22 DIAGNOSIS — Z79899 Other long term (current) drug therapy: Secondary | ICD-10-CM | POA: Insufficient documentation

## 2019-10-22 MED ORDER — PROPOFOL 10 MG/ML IV BOLUS
INTRAVENOUS | Status: AC | PRN
  Administered 2019-10-22: 15 mg via INTRAVENOUS

## 2019-10-22 MED ORDER — PROPOFOL 10 MG/ML IV BOLUS: 2.0000 mg/kg | Freq: Once | INTRAVENOUS | Status: DC

## 2019-10-22 MED ORDER — NAPROXEN 500 MG PO TABS: 500.0000 mg | ORAL_TABLET | Freq: Two times a day (BID) | ORAL | 0 refills | Status: DC

## 2019-10-22 MED ORDER — ONDANSETRON HCL 4 MG/2ML IJ SOLN
INTRAMUSCULAR | Status: AC
  Filled 2019-10-22: qty 2

## 2019-10-22 NOTE — ED Notes (Signed)
Patient was roomed in [facility] @0331am .

## 2019-10-22 NOTE — ED Notes (Addendum)
I stat beta was collected at @0404am  and resulted @0414am .

## 2019-10-22 NOTE — Sedation Documentation (Signed)
Immobilizer on right shoulder.

## 2019-10-22 NOTE — ED Provider Notes (Signed)
[facility] DEPT Provider Note   CSN: [id number] Arrival date & time: 10/22/19  0501     History Chief Complaint  Patient presents with  . Shoulder Pain    Andrea Morris is a 39 y.o. female.  HPI See shari's note for details.  I did procedural sedation     Past Morris History:  Diagnosis Date  . Anemia   . Dyspareunia   . No pertinent past Morris history     Patient Active Problem List   Diagnosis Date Noted  . Menorrhagia with regular cycle 03/21/2014  . Absolute anemia 03/21/2014    Past Surgical History:  Procedure Laterality Date  . TUBAL LIGATION  6 years ago      OB History    Gravida  5   Para  5   Term  5   Preterm      AB      Living  5     SAB      TAB      Ectopic      Multiple      Live Births              Family History  Problem Relation Age of Onset  . Diabetes Mellitus I Daughter   . Rectal cancer Mother     Social History   Tobacco Use  . Smoking status: Never Smoker  . Smokeless tobacco: Never Used  Substance Use Topics  . Alcohol use: Yes    Alcohol/week: 2.0 standard drinks    Types: 2 Standard drinks or equivalent per week  . Drug use: No    Home Medications Prior to Admission medications   Medication Sig Start Date End Date Taking? Authorizing Provider  Cholecalciferol (VITAMIN D PO) Take by mouth.    [provider]  HYDROcodone-acetaminophen (NORCO) 5-325 MG tablet Take 1-2 tablets by mouth every 6 (six) hours as needed. 10/09/18   Veryl Speak, MD  Iron-FA-B Cmp-C-Biot-Probiotic (FUSION PLUS) CAPS Take 1 capsule by mouth daily. 03/03/18   Regina Eck, CNM  Multiple Vitamins-Minerals (MULTIVITAMIN PO) Take by mouth.    [provider]  nitrofurantoin, macrocrystal-monohydrate, (MACROBID) 100 MG capsule Take one capsule BID x 7 days 05/12/18   Regina Eck, CNM    Allergies    Patient has no known allergies.  Review of Systems   Review  of Systems  Physical Exam Updated Vital Signs BP (!) 122/92   Pulse (!) 104   Temp 98.1 F (36.7 C) (Oral)   Resp (!) 22   Ht 5\' 6"  (1.676 m)   Wt 76.7 kg   SpO2 100%   BMI 27.28 kg/m   Physical Exam Constitutional:      General: She is not in acute distress. HENT:     Head: Normocephalic and atraumatic.     Nose: Nose normal.  Eyes:     Conjunctiva/sclera: Conjunctivae normal.     Pupils: Pupils are equal, round, and reactive to light.  Cardiovascular:     Rate and Rhythm: Regular rhythm. Tachycardia present.     Pulses: Normal pulses.     Heart sounds: Normal heart sounds.  Pulmonary:     Effort: Pulmonary effort is normal.     Breath sounds: Normal breath sounds.  Abdominal:     General: Abdomen is flat. Bowel sounds are normal.     Tenderness: There is no abdominal tenderness. There is no guarding.  Musculoskeletal:  General: Deformity present.  Skin:    General: Skin is warm and dry.     Capillary Refill: Capillary refill takes less than 2 seconds.  Neurological:     General: No focal deficit present.     Mental Status: She is alert.  Psychiatric:        Mood and Affect: Mood normal.     ED Results / Procedures / Treatments   Labs (all labs ordered are listed, but only abnormal results are displayed) Labs Reviewed - No data to display  EKG None  Radiology No results found.  Procedures .Sedation  Date/Time: 10/22/2019 6:31 AM Performed by: Veatrice Kells, MD Authorized by: Veatrice Kells, MD   Consent:    Consent obtained:  Verbal   Consent given by:  Patient   Risks discussed:  Allergic reaction, dysrhythmia, inadequate sedation, nausea, prolonged hypoxia resulting in organ damage, prolonged sedation necessitating reversal, respiratory compromise necessitating ventilatory assistance and intubation and vomiting   Alternatives discussed:  Analgesia without sedation, anxiolysis and regional anesthesia Universal protocol:    Procedure  explained and questions answered to patient or proxy's satisfaction: yes     Relevant documents present and verified: yes     Test results available and properly labeled: yes     Imaging studies available: yes     Required blood products, implants, devices, and special equipment available: yes     Site/side marked: yes     Immediately prior to procedure a time out was called: yes     Patient identity confirmation method:  Verbally with patient Indications:    Procedure necessitating sedation performed by:  Physician performing sedation Pre-sedation assessment:    Time since last food or drink:  9   ASA classification: class 1 - normal, healthy patient     Neck mobility: normal     Mouth opening:  3 or more finger widths   Thyromental distance:  4 finger widths   Mallampati score:  I - soft palate, uvula, fauces, pillars visible   Pre-sedation assessments completed and reviewed: airway patency, cardiovascular function, hydration status, mental status, nausea/vomiting, pain level, respiratory function and temperature   Immediate pre-procedure details:    Reassessment: Patient reassessed immediately prior to procedure     Reviewed: vital signs, relevant labs/tests and NPO status     Verified: bag valve mask available, emergency equipment available, intubation equipment available, IV patency confirmed, oxygen available and suction available   Procedure details (see MAR for exact dosages):    Preoxygenation:  Nasal cannula   Sedation:  Propofol   Intended level of sedation: deep   Intra-procedure monitoring:  Blood pressure monitoring, cardiac monitor, continuous pulse oximetry, frequent LOC assessments, frequent vital sign checks and continuous capnometry   Intra-procedure events: none     Total Provider sedation time (minutes):  29 Post-procedure details:    Attendance: Constant attendance by certified staff until patient recovered     Recovery: Patient returned to pre-procedure baseline      Post-sedation assessments completed and reviewed: airway patency, cardiovascular function, hydration status, mental status, nausea/vomiting, pain level, respiratory function and temperature     Patient is stable for discharge or admission: yes     Patient tolerance:  Tolerated well, no immediate complications   (including critical care time)  Medications Ordered in ED Medications  propofol (DIPRIVAN) 10 mg/mL bolus/IV push 153.4 mg (has no administration in time range)  ondansetron (ZOFRAN) 4 MG/2ML injection (  Given 10/22/19 0605)  propofol (DIPRIVAN) 10  mg/mL bolus/IV push (15 mg Intravenous Given 10/22/19 [id number])    ED Course  I have reviewed the triage vital signs and the nursing notes.  Pertinent labs & imaging results that were available during my care of the patient were reviewed by me and considered in my Morris decision making (see chart for details).     Final Clinical Impression(s) / ED Diagnoses   I did the sedation for the reduction see Shari's note   Phung Kotas, MD 10/25/19 2331

## 2019-10-22 NOTE — ED Provider Notes (Signed)
[facility] DEPT Provider Note   CSN: [id number] Arrival date & time: 10/22/19  0501     History Chief Complaint  Patient presents with  . Shoulder Pain    Andrea Morris is a 39 y.o. female.  Patient with a history of right shoulder dislocations presents with right shoulder pain after swinging her arm above her head and feeling the shoulder slip out of place. No other injury. No numbness. She reports last dislocation was over one year ago.   The history is provided by the patient. No language interpreter was used.  Shoulder Pain      Past Medical History:  Diagnosis Date  . Anemia   . Dyspareunia   . No pertinent past medical history     Patient Active Problem List   Diagnosis Date Noted  . Menorrhagia with regular cycle 03/21/2014  . Absolute anemia 03/21/2014    Past Surgical History:  Procedure Laterality Date  . TUBAL LIGATION  6 years ago      OB History    Gravida  5   Para  5   Term  5   Preterm      AB      Living  5     SAB      TAB      Ectopic      Multiple      Live Births              Family History  Problem Relation Age of Onset  . Diabetes Mellitus I Daughter   . Rectal cancer Mother     Social History   Tobacco Use  . Smoking status: Never Smoker  . Smokeless tobacco: Never Used  Substance Use Topics  . Alcohol use: Yes    Alcohol/week: 2.0 standard drinks    Types: 2 Standard drinks or equivalent per week  . Drug use: No    Home Medications Prior to Admission medications   Medication Sig Start Date End Date Taking? Authorizing Provider  Cholecalciferol (VITAMIN D PO) Take by mouth.    [provider]  HYDROcodone-acetaminophen (NORCO) 5-325 MG tablet Take 1-2 tablets by mouth every 6 (six) hours as needed. 10/09/18   Veryl Speak, MD  Iron-FA-B Cmp-C-Biot-Probiotic (FUSION PLUS) CAPS Take 1 capsule by mouth daily. 03/03/18   Regina Eck, CNM  Multiple  Vitamins-Minerals (MULTIVITAMIN PO) Take by mouth.    [provider]  nitrofurantoin, macrocrystal-monohydrate, (MACROBID) 100 MG capsule Take one capsule BID x 7 days 05/12/18   Regina Eck, CNM    Allergies    Patient has no known allergies.  Review of Systems   Review of Systems  Musculoskeletal:       See HPI.  Skin: Negative for color change and wound.  Neurological: Negative for weakness and numbness.    Physical Exam Updated Vital Signs BP (!) 122/92   Pulse (!) 104   Temp 98 F (36.7 C) (Oral)   Resp (!) 22   Ht 5\' 6"  (1.676 m)   Wt 76.7 kg   SpO2 100%   BMI 27.28 kg/m   Physical Exam Constitutional:      Appearance: She is well-developed.  Cardiovascular:     Pulses: Normal pulses.  Pulmonary:     Effort: Pulmonary effort is normal.  Musculoskeletal:     Cervical back: Normal range of motion.     Comments: Right shoulder deformity c/w dislocation with step off. No significant swelling.  Skin:    General: Skin is warm and dry.  Neurological:     Mental Status: She is alert and oriented to person, place, and time.     Sensory: No sensory deficit.     ED Results / Procedures / Treatments   Labs (all labs ordered are listed, but only abnormal results are displayed) Labs Reviewed - No data to display  EKG None  Radiology No results found.  Procedures Reduction of dislocation  Date/Time: 10/22/2019 6:43 AM Performed by: Charlann Lange, PA-C Authorized by: Charlann Lange, PA-C  Consent: Verbal consent obtained. Consent given by: patient Patient understanding: patient states understanding of the procedure being performed Site marked: the operative site was marked Patient identity confirmed: verbally with patient and arm band Time out: Immediately prior to procedure a "time out" was called to verify the correct patient, procedure, equipment, support staff and site/side marked as required. Local anesthesia used:  no  Anesthesia: Local anesthesia used: no  Sedation: Patient sedated: yes Sedatives: propofol Sedation start date/time: 10/22/2019 6:11 AM Sedation end date/time: 10/22/2019 6:22 AM Vitals: Vital signs were monitored during sedation.  Patient tolerance: patient tolerated the procedure well with no immediate complications Comments: Traction/countertraction applied to right shoulder with easy reduction. Verified by post-reduction imaging. Shoulder immobilizer in place.     (including critical care time)  Medications Ordered in ED Medications  propofol (DIPRIVAN) 10 mg/mL bolus/IV push 153.4 mg (153.4 mg Intravenous See Procedure Record 10/22/19 0635)  ondansetron (ZOFRAN) 4 MG/2ML injection (  Given 10/22/19 0605)  propofol (DIPRIVAN) 10 mg/mL bolus/IV push (15 mg Intravenous Given 10/22/19 [id number])    ED Course  I have reviewed the triage vital signs and the nursing notes.  Pertinent labs & imaging results that were available during my care of the patient were reviewed by me and considered in my medical decision making (see chart for details).    MDM Rules/Calculators/A&P                      Patient to ED with right shoulder dislocation. History of same. No other injury.   Right shoulder reduced under Conscious sedation with propofol. See sedation note of Dr. Randal Buba. Toradol administration and procedure delayed due to the patient having falsely registered under an assumed name.   Patient will be referred to orthopedics. Rx Naproxen.    Final Clinical Impression(s) / ED Diagnoses Final diagnoses:  None   1. Recurrent right shoulder dislocation  Rx / DC Orders ED Discharge Orders    None       Charlann Lange, PA-C 10/22/19 Merwin, April, MD 10/22/19 [phone]

## 2019-10-22 NOTE — Sedation Documentation (Signed)
Patients shoulder in place.

## 2019-10-22 NOTE — ED Notes (Signed)
Patient was given dilaudid 0.5mg  @0424am .

## 2019-10-22 NOTE — Discharge Instructions (Addendum)
Wear the immobilizer until follow up with orthopedics. Ice to reduce swelling.   Follow up with Dr. Mardelle Matte by calling the office on Monday to schedule an appointment.

## 2019-11-01 ENCOUNTER — Encounter: Payer: Self-pay | Admitting: Certified Nurse Midwife

## 2020-10-23 ENCOUNTER — Encounter: Payer: Self-pay | Admitting: Nurse Practitioner

## 2020-11-06 ENCOUNTER — Ambulatory Visit: Payer: Self-pay | Admitting: Nurse Practitioner

## 2020-11-21 ENCOUNTER — Ambulatory Visit: Payer: Self-pay | Admitting: Nurse Practitioner

## 2020-11-21 NOTE — Progress Notes (Deleted)
40 y.o. [id number] Married [place] American female here for annual exam.      No LMP recorded.            Sexually active: {yes no:314532}  The current method of family planning is tubal ligation.    Exercising: {yes no:314532}  {types:19826} Smoker:  {YES NO:22349}  Health Maintenance: Pap:  02-26-17 neg History of abnormal Pap:  no MMG:  none Colonoscopy:  none BMD:   none Gardasil:   *** Covid-19: *** Hep C testing: *** Screening Labs: ***   reports that she has never smoked. She has never used smokeless tobacco. She reports current alcohol use of about 2.0 standard drinks of alcohol per week. She reports that she does not use drugs.  Past Medical History:  Diagnosis Date  . Allergic rhinitis   . Anemia   . Dyspareunia     Past Surgical History:  Procedure Laterality Date  . TUBAL LIGATION  6 years ago     Current Outpatient Medications  Medication Sig Dispense Refill  . Cholecalciferol (VITAMIN D PO) Take by mouth.    Marland Kitchen HYDROcodone-acetaminophen (NORCO) 5-325 MG tablet Take 1-2 tablets by mouth every 6 (six) hours as needed. 12 tablet 0  . Iron-FA-B Cmp-C-Biot-Probiotic (FUSION PLUS) CAPS Take 1 capsule by mouth daily. 30 capsule 2  . Multiple Vitamins-Minerals (MULTIVITAMIN PO) Take by mouth.    . naproxen (NAPROSYN) 500 MG tablet Take 1 tablet (500 mg total) by mouth 2 (two) times daily. 30 tablet 0  . nitrofurantoin, macrocrystal-monohydrate, (MACROBID) 100 MG capsule Take one capsule BID x 7 days 14 capsule 0   No current facility-administered medications for this visit.    Family History  Problem Relation Age of Onset  . Diabetes Mellitus I Daughter   . Rectal cancer Mother     Review of Systems  Exam:   There were no vitals taken for this visit.     General appearance: alert, cooperative and appears stated age, no acute distress Head: Normocephalic, without obvious abnormality Neck: no adenopathy, thyroid {EXAM; THYROID:18604} Lungs: clear  to auscultation bilaterally Breasts: {Exam; breast:13139::"normal appearance, no masses or tenderness"} Heart: regular rate and rhythm Abdomen: soft, non-tender; no masses,  no organomegaly Extremities: extremities normal, no edema Skin: No rashes or lesions Lymph nodes: Cervical, supraclavicular, and axillary nodes normal. No abnormal inguinal nodes palpated Neurologic: Grossly normal   Pelvic: External genitalia:  no lesions              Urethra:  normal appearing urethra with no masses, tenderness or lesions              Bartholins and Skenes: normal                 Vagina: normal appearing vagina, appropriate for age, normal appearing discharge, no lesions              Cervix: neg cervical motion tenderness, no visible lesions             Bimanual Exam:   Uterus:  {exam; uterus:12215}              Adnexa: {exam; adnexa:12223}                 ***, CMA Chaperone was present for exam.  A:  Well Woman with normal exam  P:   Pap :  Mammogram:  Labs:  Medications:

## 2021-03-20 ENCOUNTER — Encounter: Payer: Self-pay | Admitting: Gastroenterology

## 2021-05-02 ENCOUNTER — Encounter: Payer: Self-pay | Admitting: Gastroenterology

## 2021-05-02 ENCOUNTER — Ambulatory Visit (INDEPENDENT_AMBULATORY_CARE_PROVIDER_SITE_OTHER): Payer: Medicaid Other | Admitting: Gastroenterology

## 2021-05-02 VITALS — BP 110/80 | HR 88 | Ht 66.0 in | Wt 182.2 lb

## 2021-05-02 DIAGNOSIS — K5900 Constipation, unspecified: Secondary | ICD-10-CM | POA: Diagnosis not present

## 2021-05-02 DIAGNOSIS — Z800 Family history of malignant neoplasm of digestive organs: Secondary | ICD-10-CM

## 2021-05-02 DIAGNOSIS — R1310 Dysphagia, unspecified: Secondary | ICD-10-CM

## 2021-05-02 MED ORDER — PLENVU 140 G PO SOLR: ORAL | 0 refills | Status: DC

## 2021-05-02 NOTE — Progress Notes (Signed)
HPI : Andrea Morris is a very pleasant 40 year old female who presents with 1 year of dysphagia.  Patient states that when she eats food she frequently feels like it is stuck in her throat (points to supraclavicular notch).  She is having this sensation most days of the week.  It is experienced with eating, but not with drinking.  She has not had any episodes where food got stuck and she had to forcefully vomit swallowed food.  The patient does admit that she is a fast eater.  No weight loss, rather her weight is increasing. She does have occasional heartburn and chest discomfort, more noticeable at night.  She has occasional regurgitation of acid or stomach contents. She states that she was prescribed a medication which she takes in the morning which is helped with the symptoms.  She has been taking it for a few months, and her symptoms are worse if she forgets to take it. She is also been having intermittent, fleeting episodes of abdominal pain.  Pain is stabbing in nature and lasts usually only seconds at a time.  Has experienced multiple days a week.  Located in the lower abdomen.  She has not noticed any sort of pattern to when she has the pain.  She has infrequent bowel movements, usually going at least 2 to 3 days without a bowel movement.  This has been a longstanding problem.  No blood in the stool. The patient's mother was diagnosed with rectal cancer in her 76s.  No other family members with colon cancer.   Past Medical History:  Diagnosis Date   Allergic rhinitis    Anemia    Dyspareunia      Past Surgical History:  Procedure Laterality Date   TUBAL LIGATION  6 years ago    Family History  Problem Relation Age of Onset   Diabetes Mellitus I Daughter    Rectal cancer Mother    Social History   Tobacco Use   Smoking status: Never   Smokeless tobacco: Never  Substance Use Topics   Alcohol use: Yes    Alcohol/week: 2.0 standard drinks    Types: 2 Standard drinks or  equivalent per week    Comment: occ   Drug use: No   Current Outpatient Medications  Medication Sig Dispense Refill   Cholecalciferol (VITAMIN D PO) Take by mouth.     Multiple Vitamins-Minerals (MULTIVITAMIN PO) Take by mouth.     nitrofurantoin, macrocrystal-monohydrate, (MACROBID) 100 MG capsule Take one capsule BID x 7 days 14 capsule 0   No current facility-administered medications for this visit.   No Known Allergies   Review of Systems: All systems reviewed and negative except where noted in HPI.    No results found.  Physical Exam: Ht 5\' 6"  (1.676 m)   Wt 182 lb 3.2 oz (82.6 kg)   BMI 29.41 kg/m  Constitutional: Pleasant,well-developed, African American female in no acute distress. HEENT: Normocephalic and atraumatic. Conjunctivae are normal. No scleral icterus. Neck supple.  Cardiovascular: Normal rate, regular rhythm.  Pulmonary/chest: Effort normal and breath sounds normal. No wheezing, rales or rhonchi. Abdominal: Soft, nondistended, nontender. Bowel sounds active throughout. There are no masses palpable. No hepatomegaly. Extremities: no edema Lymphadenopathy: No cervical adenopathy noted. Neurological: Alert and oriented to person place and time. Skin: Skin is warm and dry. No rashes noted. Psychiatric: Normal mood and affect. Behavior is normal.  CBC    Component Value Date/Time   WBC 7.4 09/30/2019 0900   RBC  3.99 09/30/2019 0900   HGB 11.6 (L) 09/30/2019 0900   HGB 11.6 03/01/2018 1551   HGB 12.9 05/02/2015 0838   HCT 36.2 09/30/2019 0900   HCT 35.3 03/01/2018 1551   PLT 225 09/30/2019 0900   PLT 304 03/01/2018 1551   MCV 90.7 09/30/2019 0900   MCV 90 03/01/2018 1551   MCH 29.1 09/30/2019 0900   MCHC 32.0 09/30/2019 0900   RDW 13.5 09/30/2019 0900   RDW 14.1 03/01/2018 1551    CMP     Component Value Date/Time   NA 139 09/30/2019 0900   K 4.0 09/30/2019 0900   CL 107 09/30/2019 0900   CO2 22 09/30/2019 0900   GLUCOSE 94 09/30/2019 0900    BUN 9 09/30/2019 0900   CREATININE 0.85 09/30/2019 0900   CALCIUM 9.2 09/30/2019 0900   PROT 6.7 11/29/2015 1951   ALBUMIN 3.6 11/29/2015 1951   AST 16 11/29/2015 1951   ALT 10 (L) 11/29/2015 1951   ALKPHOS 49 11/29/2015 1951   BILITOT 0.9 11/29/2015 1951   GFRNONAA >60 09/30/2019 0900   GFRAA >60 09/30/2019 0900     ASSESSMENT AND PLAN: 40 year old female with 1 year history of nonprogressive solid dysphagia and occasional GERD symptoms, improved with PPI, no red flag symptoms.  Suspect that her dysphagia may be GERD related, we will plan to perform EGD to assess for peptic stricture/Schatzki ring and assess for evidence of reflux esophagitis.  We will dilate if amenable lesion found. Given patient's family history of colon cancer, she should start screening early.  She will turn 40 in just over 3 months.  We will plan to perform initial screening colonoscopy now. Suspect her abdominal pain may be related to her constipation, and that this may improve if she has more frequent regular bowel movements.  I recommended she take Metamucil on a daily basis, and increase as needed to 2 or 3 times a day if no change in bowel habits after a week of daily use.  Dysphagia - EGD  Family history of colon cancer - Colonoscopy  Constipation/Abdominal pain - Metamucil   The details, risks (including bleeding, perforation, infection, missed lesions, medication reactions and possible hospitalization or surgery if complications occur), benefits, and alternatives to EGD/colonoscopy with possible biopsy and possible polypectomy were discussed with the patient and she consents to proceed.     Ottis Vacha E. Candis Schatz, MD [facility]   No ref. provider found

## 2021-05-02 NOTE — Patient Instructions (Signed)
If you are age 40 or older, your body mass index should be between 23-30. Your Body mass index is 29.41 kg/m. If this is out of the aforementioned range listed, please consider follow up with your Primary Care Provider.  If you are age 71 or younger, your body mass index should be between 19-25. Your Body mass index is 29.41 kg/m. If this is out of the aformentioned range listed, please consider follow up with your Primary Care Provider.   Take Miralax 1 capful daily as needed to achieve 1 bowel movement a day.  You have been scheduled for a colonoscopy. Please follow written instructions given to you at your visit today.  Please pick up your prep supplies at the pharmacy within the next 1-3 days. If you use inhalers (even only as needed), please bring them with you on the day of your procedure.  The [facility] providers would like to encourage you to use [facility] to communicate with providers for non-urgent requests or questions.  Due to long hold times on the telephone, sending your provider a message by [facility] may be a faster and more efficient way to get a response.  Please allow 48 business hours for a response.  Please remember that this is for non-urgent requests.   Due to recent changes in healthcare laws, you may see the results of your imaging and laboratory studies on MyChart before your provider has had a chance to review them.  We understand that in some cases there may be results that are confusing or concerning to you. Not all laboratory results come back in the same time frame and the provider may be waiting for multiple results in order to interpret others.  Please give [place] 48 hours in order for your provider to thoroughly review all the results before contacting the office for clarification of your results.   It was a pleasure to see you today!  Thank you for trusting me with your gastrointestinal care!    Scott E. Candis Schatz, MD

## 2021-05-06 ENCOUNTER — Encounter: Payer: Self-pay | Admitting: Gastroenterology

## 2021-06-13 ENCOUNTER — Ambulatory Visit (AMBULATORY_SURGERY_CENTER): Payer: Medicaid Other | Admitting: Gastroenterology

## 2021-06-13 ENCOUNTER — Encounter: Payer: Self-pay | Admitting: Gastroenterology

## 2021-06-13 ENCOUNTER — Other Ambulatory Visit: Payer: Self-pay

## 2021-06-13 VITALS — BP 112/82 | HR 76 | Temp 97.5°F | Resp 14 | Ht 66.0 in | Wt 182.0 lb

## 2021-06-13 DIAGNOSIS — K222 Esophageal obstruction: Secondary | ICD-10-CM | POA: Diagnosis not present

## 2021-06-13 DIAGNOSIS — K635 Polyp of colon: Secondary | ICD-10-CM

## 2021-06-13 DIAGNOSIS — Z800 Family history of malignant neoplasm of digestive organs: Secondary | ICD-10-CM | POA: Diagnosis not present

## 2021-06-13 DIAGNOSIS — K2970 Gastritis, unspecified, without bleeding: Secondary | ICD-10-CM

## 2021-06-13 DIAGNOSIS — K5900 Constipation, unspecified: Secondary | ICD-10-CM

## 2021-06-13 DIAGNOSIS — Z1211 Encounter for screening for malignant neoplasm of colon: Secondary | ICD-10-CM | POA: Diagnosis not present

## 2021-06-13 DIAGNOSIS — D123 Benign neoplasm of transverse colon: Secondary | ICD-10-CM

## 2021-06-13 DIAGNOSIS — R1310 Dysphagia, unspecified: Secondary | ICD-10-CM

## 2021-06-13 MED ORDER — SODIUM CHLORIDE 0.9 % IV SOLN: 500.0000 mL | Freq: Once | INTRAVENOUS | Status: DC

## 2021-06-13 NOTE — Patient Instructions (Signed)
Discharge instructions. Handouts on polyps and a Dilatation diet. Contrast for CT scan given in recovery. Resume previous medications. YOU HAD AN ENDOSCOPIC PROCEDURE TODAY AT [facility]:   Refer to the procedure report that was given to you for any specific questions about what was found during the examination.  If the procedure report does not answer your questions, please call your gastroenterologist to clarify.  If you requested that your care partner not be given the details of your procedure findings, then the procedure report has been included in a sealed envelope for you to review at your convenience later.  YOU SHOULD EXPECT: Some feelings of bloating in the abdomen. Passage of more gas than usual.  Walking can help get rid of the air that was put into your GI tract during the procedure and reduce the bloating. If you had a lower endoscopy (such as a colonoscopy or flexible sigmoidoscopy) you may notice spotting of blood in your stool or on the toilet paper. If you underwent a bowel prep for your procedure, you may not have a normal bowel movement for a few days.  Please Note:  You might notice some irritation and congestion in your nose or some drainage.  This is from the oxygen used during your procedure.  There is no need for concern and it should clear up in a day or so.  SYMPTOMS TO REPORT IMMEDIATELY:  Following lower endoscopy (colonoscopy or flexible sigmoidoscopy):  Excessive amounts of blood in the stool  Significant tenderness or worsening of abdominal pains  Swelling of the abdomen that is new, acute  Fever of 100F or higher  Following upper endoscopy (EGD)  Vomiting of blood or coffee ground material  New chest pain or pain under the shoulder blades  Painful or persistently difficult swallowing  New shortness of breath  Fever of 100F or higher  Black, tarry-looking stools  For urgent or emergent issues, a gastroenterologist can be reached at any  hour by calling [phone]. Do not use MyChart messaging for urgent concerns.    DIET:  We do recommend a small meal at first, but then you may proceed to your regular diet.  Drink plenty of fluids but you should avoid alcoholic beverages for 24 hours.  ACTIVITY:  You should plan to take it easy for the rest of today and you should NOT DRIVE or use heavy machinery until tomorrow (because of the sedation medicines used during the test).    FOLLOW UP: Our staff will call the number listed on your records 48-72 hours following your procedure to check on you and address any questions or concerns that you may have regarding the information given to you following your procedure. If we do not reach you, we will leave a message.  We will attempt to reach you two times.  During this call, we will ask if you have developed any symptoms of COVID 19. If you develop any symptoms (ie: fever, flu-like symptoms, shortness of breath, cough etc.) before then, please call [phone].  If you test positive for Covid 19 in the 2 weeks post procedure, please call and report this information to [place].    If any biopsies were taken you will be contacted by phone or by letter within the next 1-3 weeks.  Please call us at [phone] if you have not heard about the biopsies in 3 weeks.    SIGNATURES/CONFIDENTIALITY: You and/or your care partner have signed paperwork which will be entered into  your electronic medical record.  These signatures attest to the fact that that the information above on your After Visit Summary has been reviewed and is understood.  Full responsibility of the confidentiality of this discharge information lies with you and/or your care-partner.

## 2021-06-13 NOTE — Progress Notes (Signed)
Pt in recovery with monitors in place, VSS. Report given to receiving RN. Bite guard was placed with pt awake to ensure comfort. No dental or soft tissue damage noted. 

## 2021-06-13 NOTE — Progress Notes (Signed)
[facility] History and Physical   Primary Care Physician:  Patient, No Pcp Per (Inactive)   Reason for Procedure:   Dysphagia, family history of colon cancer  Plan:    EGD with possible dilation, screening colonoscopy     HPI: Andrea Morris is a 40 y.o. female with a year history of solid dysphagia, occasional GERD symptoms undergoing EGD with possible dilation.  Her mother was diagnosed with colon cancer in her 57s, here for initial screening colonoscopy.   Past Medical History:  Diagnosis Date   Allergic rhinitis    Anemia    Dyspareunia     Past Surgical History:  Procedure Laterality Date   TUBAL LIGATION  6 years ago     Prior to Admission medications   Medication Sig Start Date End Date Taking? Authorizing Provider  Cholecalciferol (VITAMIN D PO) Take 1 tablet by mouth daily.    [provider]  Multiple Vitamins-Minerals (MULTIVITAMIN PO) Take 1 tablet by mouth daily.    [provider]  pantoprazole (PROTONIX) 40 MG tablet Take 40 mg by mouth daily. 03/19/21   [provider]    Current Outpatient Medications  Medication Sig Dispense Refill   Cholecalciferol (VITAMIN D PO) Take 1 tablet by mouth daily.     Multiple Vitamins-Minerals (MULTIVITAMIN PO) Take 1 tablet by mouth daily.     pantoprazole (PROTONIX) 40 MG tablet Take 40 mg by mouth daily.     Current Facility-Administered Medications  Medication Dose Route Frequency Provider Last Rate Last Admin   0.9 %  sodium chloride infusion  500 mL Intravenous Once Daryel November, MD        Allergies as of 06/13/2021   (No Known Allergies)    Family History  Problem Relation Age of Onset   Colon polyps Mother    Colon cancer Mother    Rectal cancer Mother        within a polyp   Diabetes Mellitus I Daughter    Esophageal cancer Other    Liver cancer Neg Hx     Social History   Socioeconomic History   Marital status: Married    Spouse name: Not on file    Number of children: Not on file   Years of education: Not on file   Highest education level: Not on file  Occupational History   Not on file  Tobacco Use   Smoking status: Never   Smokeless tobacco: Never  Substance and Sexual Activity   Alcohol use: Yes    Alcohol/week: 2.0 standard drinks    Types: 2 Standard drinks or equivalent per week    Comment: occ   Drug use: No   Sexual activity: Yes    Partners: Male    Birth control/protection: Surgical    Comment: tubal ligation  Other Topics Concern   Not on file  Social History Narrative   Not on file   Social Determinants of Health   Financial Resource Strain: Not on file  Food Insecurity: Not on file  Transportation Needs: Not on file  Physical Activity: Not on file  Stress: Not on file  Social Connections: Not on file  Intimate Partner Violence: Not on file    Review of Systems:  All other review of systems negative except as mentioned in the HPI.  Physical Exam: Vital signs BP 110/73 (BP Location: Right Arm, Patient Position: Sitting, Cuff Size: Normal)   Pulse 82   Temp (!) 97.5 F (36.4 C) (Temporal)  Ht 5\' 6"  (1.676 m)   Wt 182 lb (82.6 kg)   SpO2 95%   BMI 29.38 kg/m   General:   Alert,  Well-developed, well-nourished, pleasant and cooperative in NAD MP2 Lungs:  Clear throughout to auscultation.   Heart:  Regular rate and rhythm; no murmurs, clicks, rubs,  or gallops. Abdomen:  Soft, nontender and nondistended. Normal bowel sounds.   Neuro/Psych:  Normal mood and affect. A and O x 3   Cherity Blickenstaff E. Candis Schatz, MD [facility]

## 2021-06-13 NOTE — Progress Notes (Signed)
Pt's states no medical or surgical changes since previsit or office visit. 

## 2021-06-13 NOTE — Progress Notes (Signed)
Called to room to assist during endoscopic procedure.  Patient ID and intended procedure confirmed with present staff. Received instructions for my participation in the procedure from the performing physician.  

## 2021-06-13 NOTE — Op Note (Signed)
[place] Patient Name: Andrea Morris Procedure Date: 06/13/2021 3:05 PM MRN: [id number] Endoscopist: Nicki Reaper E. Candis Schatz , MD Age: 40 Referring MD:  Date of Birth: [birth year] Gender: Female Account #: [id number] Procedure:                Colonoscopy Indications:              Screening in patient at increased risk: Colorectal                            cancer in mother before age 74 Medicines:                Monitored Anesthesia Care Procedure:                Pre-Anesthesia Assessment:                           - Prior to the procedure, a History and Physical                            was performed, and patient medications and                            allergies were reviewed. The patient's tolerance of                            previous anesthesia was also reviewed. The risks                            and benefits of the procedure and the sedation                            options and risks were discussed with the patient.                            All questions were answered, and informed consent                            was obtained. Prior Anticoagulants: The patient has                            taken no previous anticoagulant or antiplatelet                            agents. ASA Grade Assessment: II - A patient with                            mild systemic disease. After reviewing the risks                            and benefits, the patient was deemed in                            satisfactory condition to undergo the procedure.  After obtaining informed consent, the colonoscope                            was passed under direct vision. Throughout the                            procedure, the patient's blood pressure, pulse, and                            oxygen saturations were monitored continuously. The                            CF [id number] #[id number] was introduced through the anus                            and advanced to the  the terminal ileum, with                            identification of the appendiceal orifice and IC                            valve. The colonoscopy was performed without                            difficulty. The patient tolerated the procedure                            well. The quality of the bowel preparation was                            excellent. The terminal ileum, ileocecal valve,                            appendiceal orifice, and rectum were photographed. Scope In: 3:20:55 PM Scope Out: 3:38:03 PM Scope Withdrawal Time: 0 hours 11 minutes 54 seconds  Total Procedure Duration: 0 hours 17 minutes 8 seconds  Findings:                 The perianal and digital rectal examinations were                            normal. Pertinent negatives include normal                            sphincter tone and no palpable rectal lesions.                           A 5 mm polyp was found in the transverse Morris. The                            polyp was pedunculated. The polyp was removed with                            a hot snare. Resection  and retrieval were complete.                            Estimated blood loss: none.                           There was a luminal deformity in the cecum, most                            likely secondary to extrinsic compression of the                            Morris                           The exam was otherwise normal throughout the                            examined Morris.                           The terminal ileum appeared normal.                           The retroflexed view of the distal rectum and anal                            verge was normal and showed no anal or rectal                            abnormalities. Complications:            No immediate complications. Estimated Blood Loss:     Estimated blood loss: none. Impression:               - One 5 mm polyp in the transverse Morris, removed                            with a hot snare.  Resected and retrieved.                           - The examined portion of the ileum was normal.                           - The distal rectum and anal verge are normal on                            retroflexion view. Recommendation:           - Patient has a contact number available for                            emergencies. The signs and symptoms of potential                            delayed complications were discussed with the  patient. Return to normal activities tomorrow.                            Written discharge instructions were provided to the                            patient.                           - Resume previous diet.                           - Continue present medications.                           - Await pathology results.                           - Perform a CT scan (computed tomography) of                            abdomen with contrast and pelvis with contrast at                            appointment to be scheduled.                           - Repeat colonoscopy in 5 years for screening                            purposes. Darriel Utter E. Candis Schatz, MD 06/13/2021 3:54:09 PM This report has been signed electronically.

## 2021-06-13 NOTE — Op Note (Signed)
Andrea Morris Procedure Date: 06/13/2021 3:06 PM MRN: [id number] Endoscopist: Nicki Reaper E. Candis Schatz , MD Age: 40 Referring MD:  Date of Birth: [birth year] Gender: Female Account #: [id number] Procedure:                Upper GI endoscopy Indications:              Dysphagia Medicines:                Monitored Anesthesia Care Procedure:                Pre-Anesthesia Assessment:                           - Prior to the procedure, a History and Physical                            was performed, and patient medications and                            allergies were reviewed. The patient's tolerance of                            previous anesthesia was also reviewed. The risks                            and benefits of the procedure and the sedation                            options and risks were discussed with the patient.                            All questions were answered, and informed consent                            was obtained. Prior Anticoagulants: The patient has                            taken no previous anticoagulant or antiplatelet                            agents. ASA Grade Assessment: II - A patient with                            mild systemic disease. After reviewing the risks                            and benefits, the patient was deemed in                            satisfactory condition to undergo the procedure.                           After obtaining informed consent, the endoscope was  passed under direct vision. Throughout the                            procedure, the patient's blood pressure, pulse, and                            oxygen saturations were monitored continuously. The                            Endoscope was introduced through the mouth, and                            advanced to the third part of duodenum. The upper                            GI endoscopy was accomplished without  difficulty.                            The patient tolerated the procedure well. Scope In: Scope Out: Findings:                 The examined portions of the nasopharynx,                            oropharynx and larynx were normal.                           One benign-appearing, intrinsic mild stenosis was                            found at the gastroesophageal junction. This                            stenosis measured 1.5 cm (inner diameter) x less                            than one cm (in length). The stenosis was                            traversed. A TTS dilator was passed through the                            scope. Dilation with an 18-19-20 mm balloon dilator                            was performed to 20 mm. The dilation site was                            examined and showed no change. Estimated blood                            loss: none. Biopsies were taken with a cold forceps  for histology. Estimated blood loss was minimal.                           The exam of the esophagus was otherwise normal.                           The entire examined stomach was normal.                           The examined duodenum was normal. Complications:            No immediate complications. Estimated Blood Loss:     Estimated blood loss was minimal. Impression:               - The examined portions of the nasopharynx,                            oropharynx and larynx were normal.                           - Benign-appearing esophageal stenosis. Dilated.                            Biopsied.                           - Normal stomach.                           - Normal examined duodenum. Recommendation:           - Patient has a contact number available for                            emergencies. The signs and symptoms of potential                            delayed complications were discussed with the                            patient. Return to normal  activities tomorrow.                            Written discharge instructions were provided to the                            patient.                           - Resume previous diet.                           - Continue present medications.                           - Await pathology results.                           -  Follow up as needed with Dr. Candis Schatz for                            ongoing dysphagia symptoms. Andrea Morris E. Candis Schatz, MD 06/13/2021 3:45:48 PM This report has been signed electronically.

## 2021-06-16 ENCOUNTER — Other Ambulatory Visit: Payer: Self-pay

## 2021-06-16 DIAGNOSIS — Z800 Family history of malignant neoplasm of digestive organs: Secondary | ICD-10-CM

## 2021-06-17 ENCOUNTER — Telehealth: Payer: Self-pay

## 2021-06-17 NOTE — Telephone Encounter (Signed)
  Follow up Call-  Call back number 06/13/2021  Post procedure Call Back phone  # [phone]  Permission to leave phone message Yes  Some recent data might be hidden     Patient questions:  Do you have a fever, pain , or abdominal swelling? No. Pain Score  0 *  Have you tolerated food without any problems? Yes.    Have you been able to return to your normal activities? Yes.    Do you have any questions about your discharge instructions: Diet   No. Medications  No. Follow up visit  No.  Do you have questions or concerns about your Care? No.  Actions: * If pain score is 4 or above: No action needed, pain <4.

## 2021-06-23 ENCOUNTER — Encounter: Payer: Self-pay | Admitting: Gastroenterology

## 2021-06-24 ENCOUNTER — Telehealth: Payer: Self-pay | Admitting: Gastroenterology

## 2021-06-24 NOTE — Telephone Encounter (Signed)
Order was placed for CT of A/P with contrast on 06/16/21. Reached back out to rad scheduling to have them set  this up with the pt.  Pt reports since her dilation she has had a feeling of gas getting stuck in her chest. Reports she is having to make herself burp to release it, feels like it is stuck. Pt states she is not taking any reflux medication. Please advise.

## 2021-06-24 NOTE — Telephone Encounter (Signed)
Patient called.  States she is having chest pains following procedure last week.  She also wants to go ahead and set up the CAT scan Dr. Candis Schatz spoke to her about.  Please call.  Thank you.

## 2021-06-25 NOTE — Telephone Encounter (Signed)
Left message for pt to call back.  Spoke with Andrea Morris and she is aware of Dr. Dayle Points recommendations. She would like to try the omeprazole, script sent to pharmacy.

## 2021-06-26 MED ORDER — OMEPRAZOLE 20 MG PO CPDR: 20.0000 mg | DELAYED_RELEASE_CAPSULE | Freq: Every day | ORAL | 0 refills | Status: AC

## 2021-08-04 ENCOUNTER — Emergency Department (HOSPITAL_COMMUNITY): Payer: Medicaid Other

## 2021-08-04 ENCOUNTER — Other Ambulatory Visit: Payer: Self-pay

## 2021-08-04 ENCOUNTER — Emergency Department (HOSPITAL_COMMUNITY)
Admission: EM | Admit: 2021-08-04 | Discharge: 2021-08-04 | Disposition: A | Discharge status: Home / Self Care | Payer: Medicaid Other | Attending: Emergency Medicine | Admitting: Emergency Medicine

## 2021-08-04 DIAGNOSIS — M24411 Recurrent dislocation, right shoulder: Secondary | ICD-10-CM | POA: Diagnosis not present

## 2021-08-04 DIAGNOSIS — J40 Bronchitis, not specified as acute or chronic: Secondary | ICD-10-CM | POA: Insufficient documentation

## 2021-08-04 MED ORDER — AZITHROMYCIN 250 MG PO TABS: 250.0000 mg | ORAL_TABLET | Freq: Every day | ORAL | 0 refills | Status: AC

## 2021-08-04 MED ORDER — ONDANSETRON HCL 4 MG/2ML IJ SOLN
4.0000 mg | Freq: Once | INTRAMUSCULAR | Status: AC
  Administered 2021-08-04: 06:00:00 4 mg via INTRAVENOUS
  Filled 2021-08-04: qty 2

## 2021-08-04 MED ORDER — HYDROMORPHONE HCL 1 MG/ML IJ SOLN
1.0000 mg | Freq: Once | INTRAMUSCULAR | Status: AC
  Administered 2021-08-04: 06:00:00 1 mg via INTRAVENOUS
  Filled 2021-08-04: qty 1

## 2021-08-04 MED ORDER — PROPOFOL 10 MG/ML IV BOLUS
1.0000 mg/kg | Freq: Once | INTRAVENOUS | Status: DC
  Filled 2021-08-04: qty 20

## 2021-08-04 MED ORDER — PROPOFOL 10 MG/ML IV BOLUS
INTRAVENOUS | Status: AC | PRN
  Administered 2021-08-04: 77.1 mg via INTRAVENOUS

## 2021-08-04 NOTE — ED Triage Notes (Signed)
Pt c/o right shoulder pain. Pt states she thinks she dislocated her shoulder while reaching to put her phone on the night stand. Deformity noted to right shoulder.

## 2021-08-04 NOTE — ED Provider Notes (Signed)
[facility]PART[facility] Provider Note   CSN: [id number] Arrival date & time: 08/04/21  0126     History Chief Complaint  Patient presents with   Shoulder Pain    Andrea Morris is a 40 y.o. female.  Patient presents to the emergency department for evaluation of acute onset right shoulder pain.  Patient reports a history of prior shoulder dislocations.  Patient reports that the pain suddenly started when she made a slight movement of the right shoulder.  This feels like prior dislocations.  Pain is constant and severe.  Patient also reports that she has had cough, chest congestion has ongoing for several weeks.  Patient still producing thick yellowish-green sputum.  No fever or shortness of breath.      Past Medical History:  Diagnosis Date   Allergic rhinitis    Anemia    Dyspareunia     Patient Active Problem List   Diagnosis Date Noted   Menorrhagia with regular cycle 03/21/2014   Absolute anemia 03/21/2014    Past Surgical History:  Procedure Laterality Date   TUBAL LIGATION  6 years ago      OB History     Gravida  5   Para  5   Term  5   Preterm      AB      Living  5      SAB      IAB      Ectopic      Multiple      Live Births              Family History  Problem Relation Age of Onset   Colon polyps Mother    Colon cancer Mother    Rectal cancer Mother        within a polyp   Diabetes Mellitus I Daughter    Esophageal cancer Other    Liver cancer Neg Hx     Social History   Tobacco Use   Smoking status: Never   Smokeless tobacco: Never  Substance Use Topics   Alcohol use: Yes    Alcohol/week: 2.0 standard drinks    Types: 2 Standard drinks or equivalent per week    Comment: occ   Drug use: No    Home Medications Prior to Admission medications   Medication Sig Start Date End Date Taking? Authorizing Provider  azithromycin (ZITHROMAX) 250 MG tablet Take 1 tablet (250 mg total) by  mouth daily. Take first 2 tablets together, then 1 every day until finished. 08/04/21  Yes Aaliyah Gavel, Gwenyth Allegra, MD  omeprazole (PRILOSEC) 20 MG capsule Take 1 capsule (20 mg total) by mouth daily. 06/26/21   Daryel November, MD  Cholecalciferol (VITAMIN D PO) Take 1 tablet by mouth daily.    [provider]  Multiple Vitamins-Minerals (MULTIVITAMIN PO) Take 1 tablet by mouth daily.    [provider]    Allergies    Patient has no known allergies.  Review of Systems   Review of Systems  HENT:  Positive for congestion.   Respiratory:  Positive for cough.   Musculoskeletal:  Positive for arthralgias.  Neurological: Negative.   All other systems reviewed and are negative.  Physical Exam Updated Vital Signs BP 122/84    Pulse 84    Temp 98.5 F (36.9 C) (Oral)    Resp (!) 21    Ht 5\' 6"  (1.676 m)    Wt 77.1 kg    SpO2  100%    BMI 27.44 kg/m   Physical Exam Vitals and nursing note reviewed.  Constitutional:      General: She is not in acute distress.    Appearance: Normal appearance. She is well-developed.  HENT:     Head: Normocephalic and atraumatic.     Right Ear: Hearing normal.     Left Ear: Hearing normal.     Nose: Nose normal.  Eyes:     Conjunctiva/sclera: Conjunctivae normal.     Pupils: Pupils are equal, round, and reactive to light.  Cardiovascular:     Rate and Rhythm: Regular rhythm.     Pulses:          Radial pulses are 2+ on the right side.     Heart sounds: S1 normal and S2 normal. No murmur heard.   No friction rub. No gallop.  Pulmonary:     Effort: Pulmonary effort is normal. No respiratory distress.     Breath sounds: Normal breath sounds.  Chest:     Chest wall: No tenderness.  Abdominal:     General: Bowel sounds are normal.     Palpations: Abdomen is soft.     Tenderness: There is no abdominal tenderness. There is no guarding or rebound. Negative signs include Murphy's sign and McBurney's sign.     Hernia: No hernia is  present.  Musculoskeletal:     Right shoulder: Deformity and tenderness present. Decreased range of motion.     Cervical back: Normal range of motion and neck supple.  Skin:    General: Skin is warm and dry.     Findings: No rash.  Neurological:     Mental Status: She is alert and oriented to person, place, and time.     GCS: GCS eye subscore is 4. GCS verbal subscore is 5. GCS motor subscore is 6.     Cranial Nerves: No cranial nerve deficit.     Sensory: No sensory deficit.     Coordination: Coordination normal.  Psychiatric:        Speech: Speech normal.        Behavior: Behavior normal.        Thought Content: Thought content normal.    ED Results / Procedures / Treatments   Labs (all labs ordered are listed, but only abnormal results are displayed) Labs Reviewed - No data to display  EKG None  Radiology DG Shoulder Right  Result Date: 08/04/2021 CLINICAL DATA:  Shoulder displacement and pain, possible dislocation EXAM: RIGHT SHOULDER - 2+ VIEW COMPARISON:  10/22/2019 FINDINGS: There is evidence of recurrent anterior inferior humeral head dislocation with respect to the glenoid. Hill-Sachs deformity is noted as well as what appears to be an undisplaced greater tuberosity fracture. No other focal abnormality is seen. IMPRESSION: Dislocation with Hill-Sachs deformity and findings suggestive of greater tuberosity fracture. Electronically Signed   By: Inez Catalina M.D.   On: 08/04/2021 02:42    Procedures .Sedation  Date/Time: 08/04/2021 6:16 AM Performed by: Orpah Greek, MD Authorized by: Orpah Greek, MD   Consent:    Consent obtained:  Written   Consent given by:  Patient   Risks discussed:  Prolonged hypoxia resulting in organ damage, dysrhythmia, inadequate sedation, respiratory compromise necessitating ventilatory assistance and intubation, nausea and vomiting Universal protocol:    Procedure explained and questions answered to patient or  proxy's satisfaction: yes     Relevant documents present and verified: yes     Test results available: yes  Imaging studies available: yes     Required blood products, implants, devices, and special equipment available: yes     Site/side marked: yes     Immediately prior to procedure, a time out was called: yes     Patient identity confirmed:  Verbally with patient Indications:    Procedure performed:  Dislocation reduction   Procedure necessitating sedation performed by:  Physician performing sedation Pre-sedation assessment:    Time since last food or drink:  9   ASA classification: class 1 - normal, healthy patient     Mouth opening:  3 or more finger widths   Thyromental distance:  3 finger widths   Mallampati score:  II - soft palate, uvula, fauces visible   Neck mobility: normal     Pre-sedation assessments completed and reviewed: airway patency, cardiovascular function, hydration status, mental status, nausea/vomiting, pain level, respiratory function and temperature   Immediate pre-procedure details:    Reassessment: Patient reassessed immediately prior to procedure     Reviewed: vital signs, relevant labs/tests and NPO status     Verified: bag valve mask available, emergency equipment available, intubation equipment available, IV patency confirmed, oxygen available and suction available   Procedure details (see MAR for exact dosages):    Preoxygenation:  Nasal cannula   Sedation:  Propofol   Intended level of sedation: deep   Analgesia:  Hydromorphone   Intra-procedure monitoring:  Blood pressure monitoring, cardiac monitor, continuous pulse oximetry, continuous capnometry, frequent LOC assessments and frequent vital sign checks   Intra-procedure events: none     Total Provider sedation time (minutes):  15 Post-procedure details:    Attendance: Constant attendance by certified staff until patient recovered     Recovery: Patient returned to pre-procedure baseline      Post-sedation assessments completed and reviewed: airway patency, cardiovascular function, hydration status, mental status, nausea/vomiting, pain level, respiratory function and temperature     Patient is stable for discharge or admission: yes     Procedure completion:  Tolerated well, no immediate complications .Ortho Injury Treatment  Date/Time: 08/04/2021 6:18 AM Performed by: Orpah Greek, MD Authorized by: Orpah Greek, MD   Consent:    Consent obtained:  Verbal   Consent given by:  Patient   Risks discussed:  Fracture, nerve damage, restricted joint movement, vascular damage, recurrent dislocation and irreducible dislocationInjury location: shoulder Location details: right shoulder Injury type: dislocation Dislocation type: anterior Hill-Sachs deformity: yes Chronicity: recurrent Pre-procedure neurovascular assessment: neurovascularly intact Pre-procedure distal perfusion: normal Pre-procedure neurological function: normal Pre-procedure range of motion: reduced  Anesthesia: Local anesthesia used: no  Patient sedated: Yes. Refer to sedation procedure documentation for details of sedation. Manipulation performed: yes Reduction method: external rotation and Milch technique Reduction successful: yes X-ray confirmed reduction: yes Immobilization: sling Splint Applied by: Sheliah Hatch Post-procedure neurovascular assessment: post-procedure neurovascularly intact Post-procedure distal perfusion: normal Post-procedure neurological function: normal Post-procedure range of motion: improved     Medications Ordered in ED Medications  propofol (DIPRIVAN) 10 mg/mL bolus/IV push 77.1 mg (has no administration in time range)  HYDROmorphone (DILAUDID) injection 1 mg (1 mg Intravenous Given 08/04/21 0553)  ondansetron (ZOFRAN) injection 4 mg (4 mg Intravenous Given 08/04/21 0553)  propofol (DIPRIVAN) 10 mg/mL bolus/IV push (77.1 mg Intravenous Given 08/04/21 6144)     ED Course  I have reviewed the triage vital signs and the nursing notes.  Pertinent labs & imaging results that were available during my care of the patient were reviewed by me and  considered in my medical decision making (see chart for details).    MDM Rules/Calculators/A&P                         Patient presents with complaints of right shoulder pain.  Patient has a history of recurrent shoulder dislocations.  X-ray confirms dislocation.  Patient does have possible tuberosity fracture and Hill-Sachs deformity.  Unknown if this is chronic.  Discussed risks and benefits, recommended close reduction.  Patient gave consent and this was performed without difficulty.  Immobilizer placed.  We will follow-up with orthopedics.  Patient also complaining of persistent upper respiratory infection symptoms ongoing for more than 2 weeks.  Patient with productive cough.  She is not hypoxic.  Lungs are clear without signs of clinical pneumonia.     Final Clinical Impression(s) / ED Diagnoses Final diagnoses:  Shoulder dislocation, recurrent, right  Bronchitis    Rx / DC Orders ED Discharge Orders          Ordered    azithromycin (ZITHROMAX) 250 MG tablet  Daily        08/04/21 0629             Orpah Greek, MD 08/04/21 [id number]

## 2021-08-04 NOTE — Progress Notes (Signed)
Orthopedic Tech Progress Note Patient Details:  ELIYA BUBAR Jul 13, 1981 [id number]  Ortho Devices Type of Ortho Device: Sling immobilizer Ortho Device/Splint Location: rue Ortho Device/Splint Interventions: Ordered, Application, Adjustment  I applied post reduction. Post Interventions Patient Tolerated: Well Instructions Provided: Care of device, Adjustment of device  Karolee Stamps 08/04/2021, 6:16 AM

## 2022-11-05 IMAGING — DX DG SHOULDER 2+V PORT*R*
1 series · 1 of 1 positions shown · non-contrast
Comparison: Right shoulder series [DATE] hours.

CLINICAL DATA: 40-year-old female status post reduction of anterior
glenohumeral dislocation.

EXAM:
PORTABLE RIGHT SHOULDER

[shoulder]
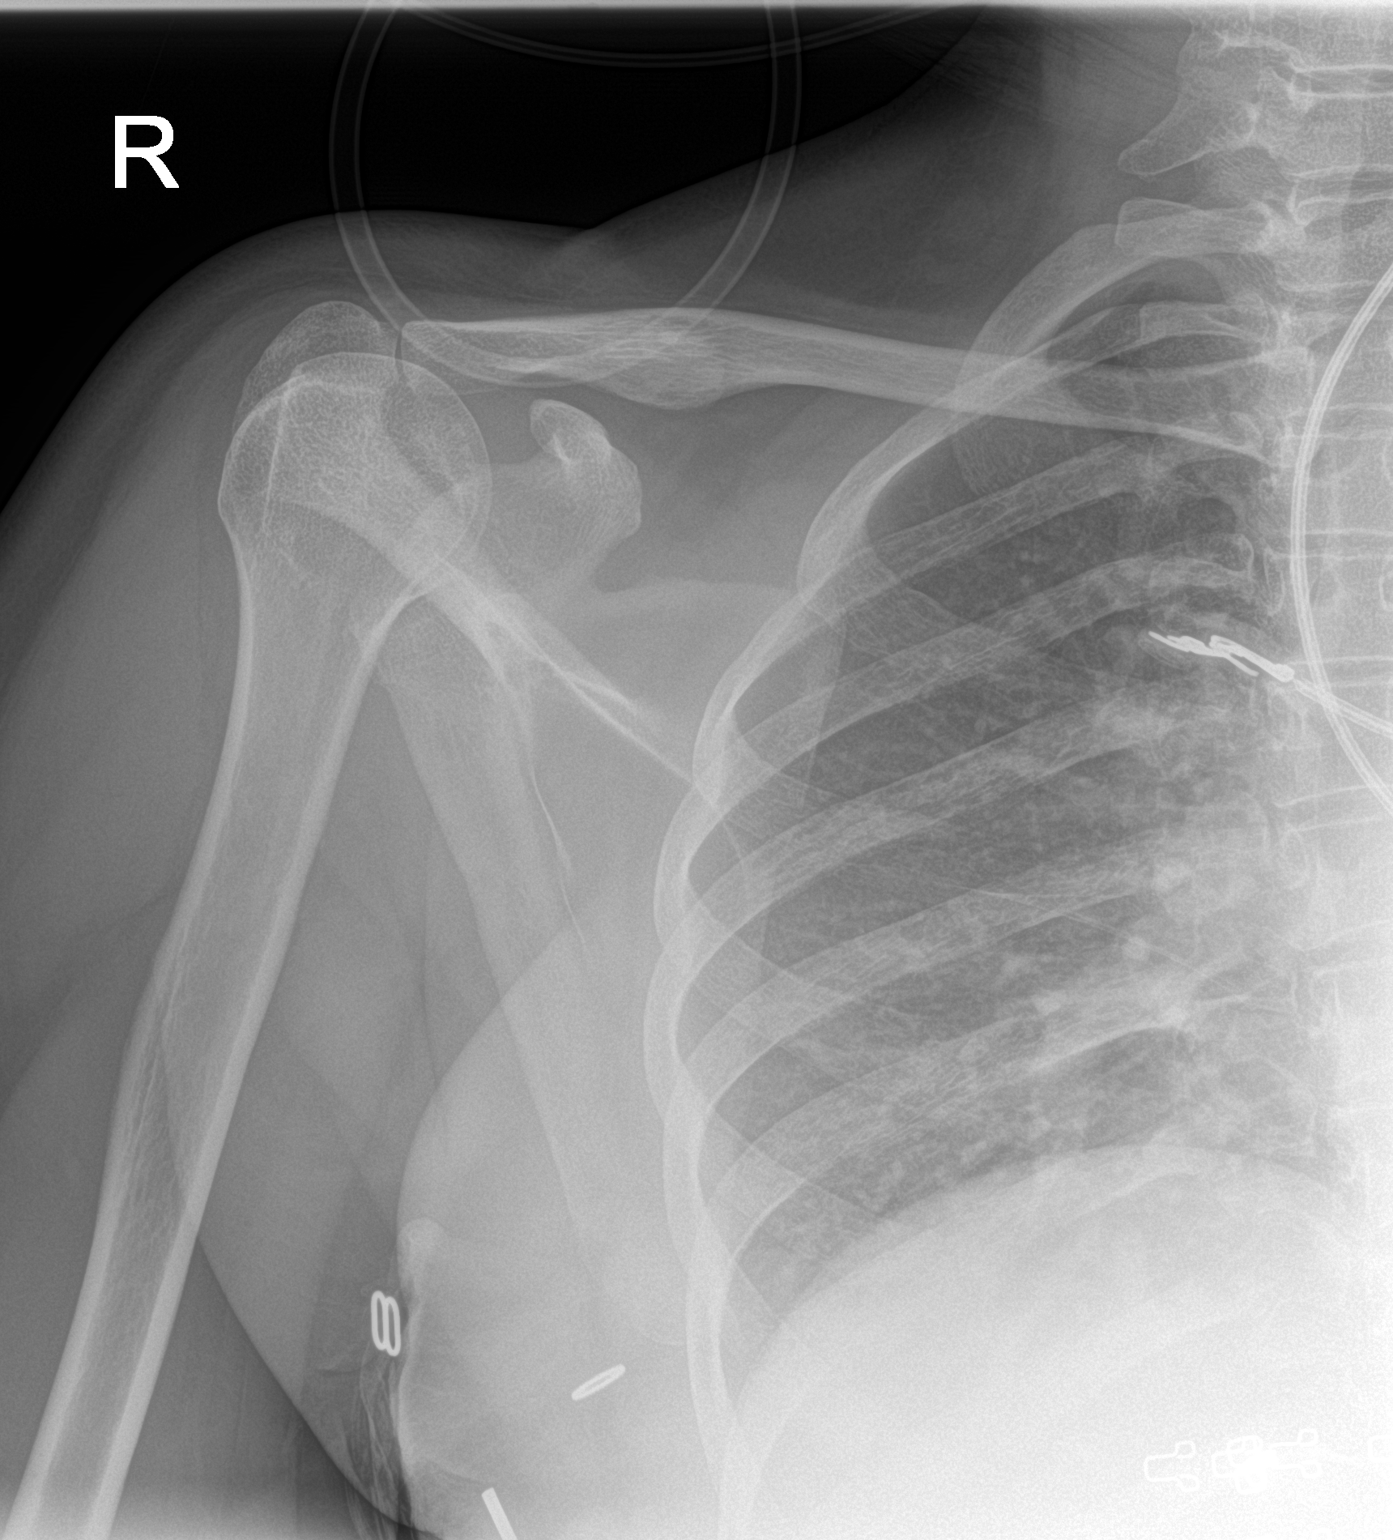

[1 of 1 positions shown; findings below may reference images not displayed]

FINDINGS: Single AP view of the right shoulder. Glenohumeral alignment
improved and likely normal. No fracture identified. Normal
underlying bone mineralization. Negative visible right chest.
IMPRESSION: Suspect reduced glenohumeral joint dislocation on this single view
(axillary or scapular-Y view would radiographically confirm if
necessary). No fracture identified.

## 2022-11-05 IMAGING — CR DG SHOULDER 2+V*R*
3 series · 3 of 3 positions shown · non-contrast
Comparison: 10/22/2019

CLINICAL DATA: Shoulder displacement and pain, possible dislocation

EXAM:
RIGHT SHOULDER - 2+ VIEW

[shoulder grashey]
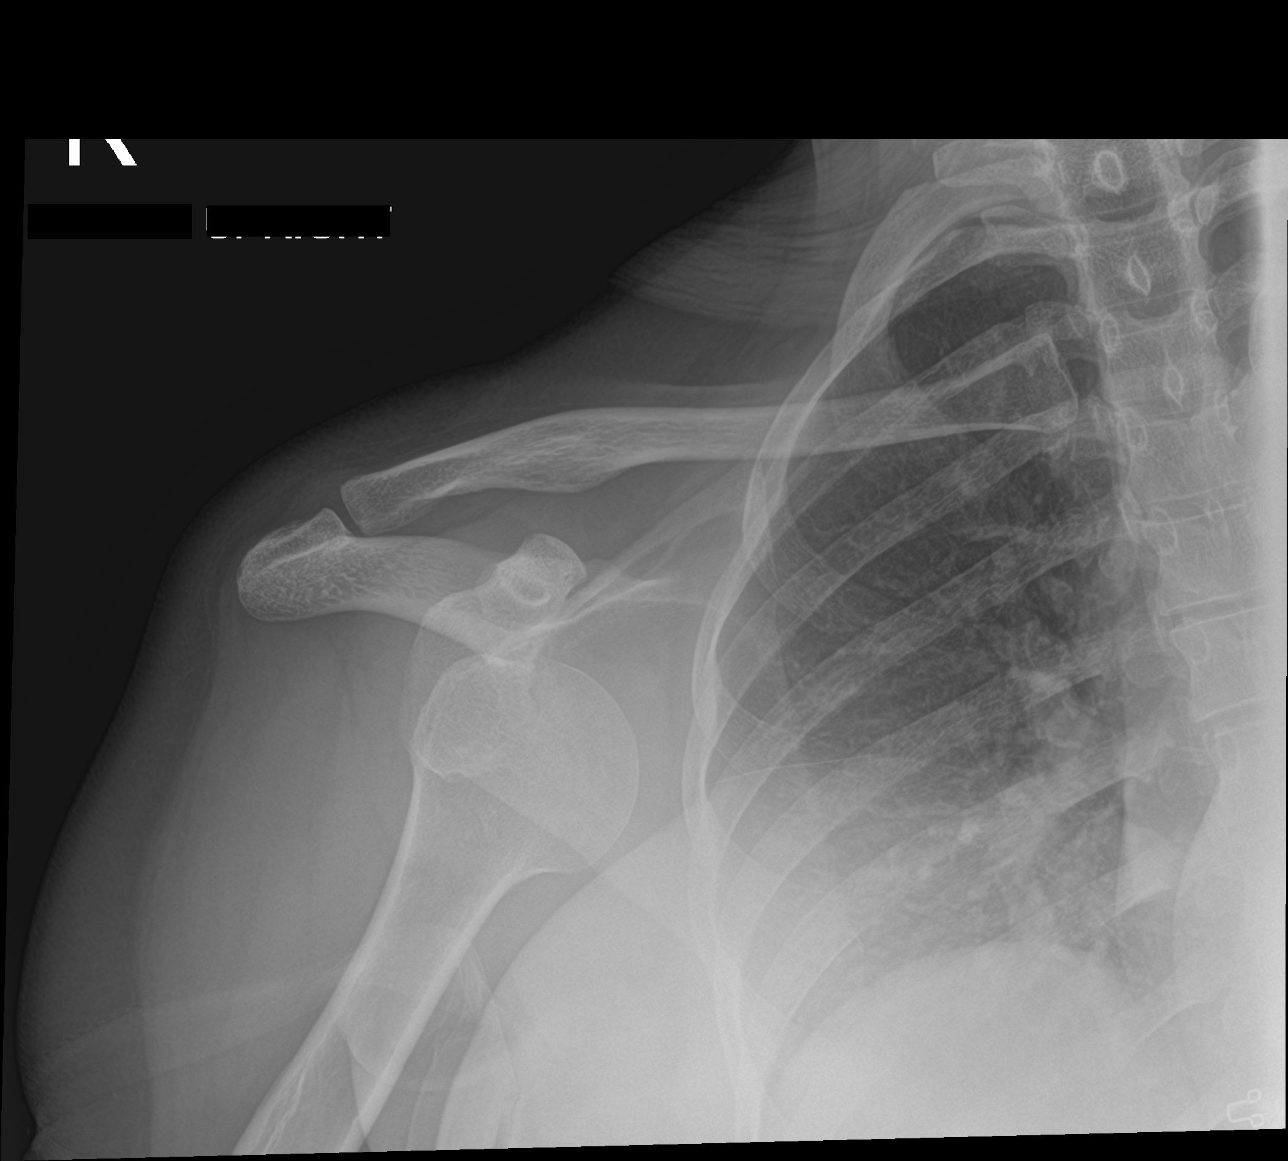

[shoulder y view]
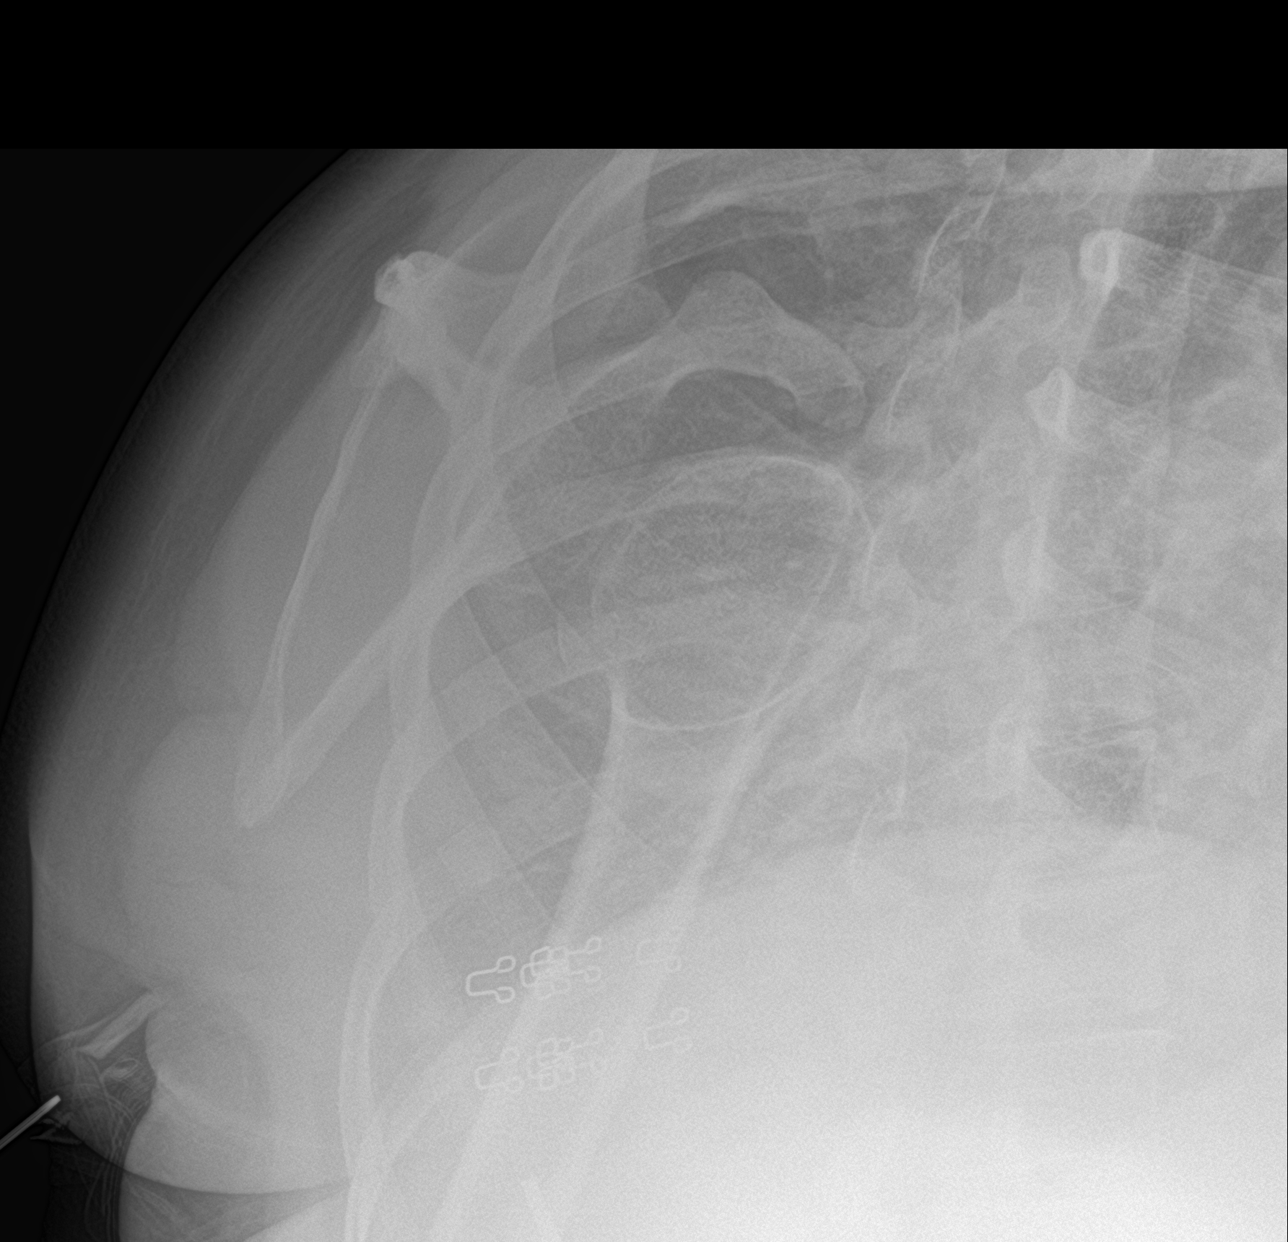

[shoulder ap neutral]
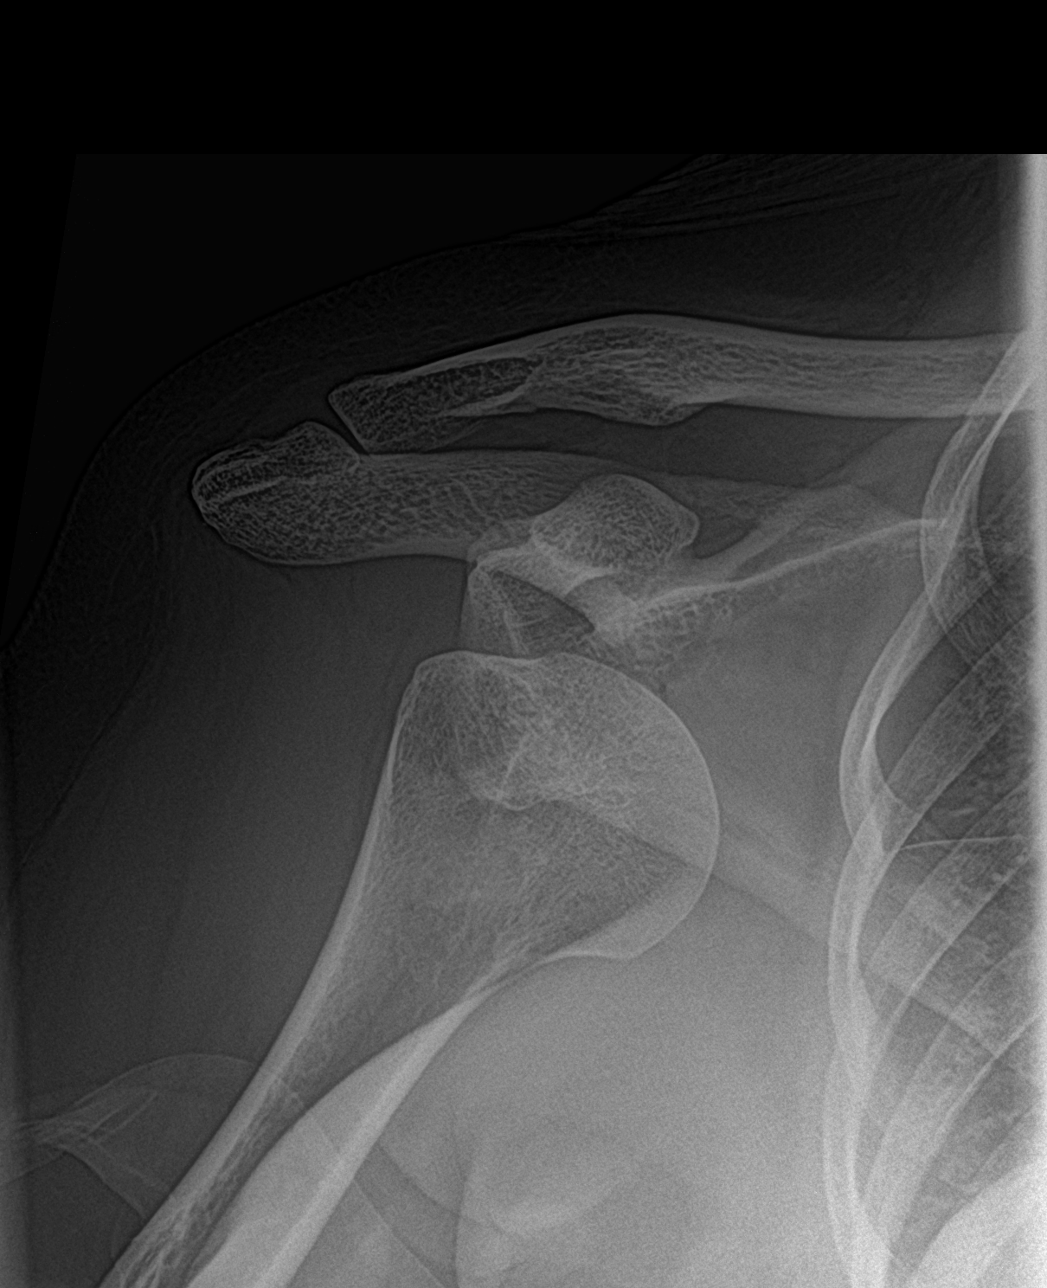

[3 of 3 positions shown; findings below may reference images not displayed]

FINDINGS: There is evidence of recurrent anterior inferior humeral head
dislocation with respect to the glenoid. Hill-Sachs deformity is
noted as well as what appears to be an undisplaced greater
tuberosity fracture. No other focal abnormality is seen.
IMPRESSION: Dislocation with Hill-Sachs deformity and findings suggestive of
greater tuberosity fracture.

## 2023-10-20 ENCOUNTER — Other Ambulatory Visit: Payer: Self-pay | Admitting: Physician Assistant

## 2023-10-20 DIAGNOSIS — Z1231 Encounter for screening mammogram for malignant neoplasm of breast: Secondary | ICD-10-CM

## 2023-11-10 ENCOUNTER — Ambulatory Visit
